# Patient Record
Sex: Male | Born: 1950 | Race: Black or African American | Hispanic: No | Marital: Married | Smoking: Former smoker
Health system: Southern US, Community
[De-identification: ages and names within clinical notes are randomized; demographics above are authoritative.]

## PROBLEM LIST (undated history)

## (undated) DIAGNOSIS — E7800 Pure hypercholesterolemia, unspecified: Secondary | ICD-10-CM

## (undated) DIAGNOSIS — R000 Tachycardia, unspecified: Secondary | ICD-10-CM

## (undated) DIAGNOSIS — Z9109 Other allergy status, other than to drugs and biological substances: Secondary | ICD-10-CM

## (undated) DIAGNOSIS — S299XXA Unspecified injury of thorax, initial encounter: Secondary | ICD-10-CM

## (undated) DIAGNOSIS — J9819 Other pulmonary collapse: Secondary | ICD-10-CM

## (undated) DIAGNOSIS — J449 Chronic obstructive pulmonary disease, unspecified: Secondary | ICD-10-CM

## (undated) DIAGNOSIS — N400 Enlarged prostate without lower urinary tract symptoms: Secondary | ICD-10-CM

## (undated) HISTORY — PX: CHEST TUBE INSERTION: SHX231

---

## 2002-12-12 ENCOUNTER — Emergency Department (HOSPITAL_COMMUNITY): Admission: EM | Admit: 2002-12-12 | Discharge: 2002-12-12 | Payer: Self-pay | Admitting: Emergency Medicine

## 2005-06-07 ENCOUNTER — Emergency Department (HOSPITAL_COMMUNITY): Admission: EM | Admit: 2005-06-07 | Discharge: 2005-06-07 | Payer: Self-pay | Admitting: Emergency Medicine

## 2015-03-04 ENCOUNTER — Emergency Department (HOSPITAL_COMMUNITY)
Admission: EM | Admit: 2015-03-04 | Discharge: 2015-03-04 | Disposition: A | Discharge status: Home / Self Care | Payer: Medicare Other | Attending: Emergency Medicine | Admitting: Emergency Medicine

## 2015-03-04 ENCOUNTER — Encounter (HOSPITAL_COMMUNITY): Payer: Self-pay | Admitting: Emergency Medicine

## 2015-03-04 DIAGNOSIS — X500XXA Overexertion from strenuous movement or load, initial encounter: Secondary | ICD-10-CM | POA: Diagnosis not present

## 2015-03-04 DIAGNOSIS — Z79899 Other long term (current) drug therapy: Secondary | ICD-10-CM | POA: Insufficient documentation

## 2015-03-04 DIAGNOSIS — Z7951 Long term (current) use of inhaled steroids: Secondary | ICD-10-CM | POA: Diagnosis not present

## 2015-03-04 DIAGNOSIS — N400 Enlarged prostate without lower urinary tract symptoms: Secondary | ICD-10-CM | POA: Insufficient documentation

## 2015-03-04 DIAGNOSIS — Y9389 Activity, other specified: Secondary | ICD-10-CM | POA: Diagnosis not present

## 2015-03-04 DIAGNOSIS — S3992XA Unspecified injury of lower back, initial encounter: Secondary | ICD-10-CM | POA: Diagnosis present

## 2015-03-04 DIAGNOSIS — Y998 Other external cause status: Secondary | ICD-10-CM | POA: Insufficient documentation

## 2015-03-04 DIAGNOSIS — M4306 Spondylolysis, lumbar region: Secondary | ICD-10-CM | POA: Insufficient documentation

## 2015-03-04 DIAGNOSIS — Y9289 Other specified places as the place of occurrence of the external cause: Secondary | ICD-10-CM | POA: Insufficient documentation

## 2015-03-04 DIAGNOSIS — S39012A Strain of muscle, fascia and tendon of lower back, initial encounter: Secondary | ICD-10-CM | POA: Diagnosis not present

## 2015-03-04 DIAGNOSIS — F17210 Nicotine dependence, cigarettes, uncomplicated: Secondary | ICD-10-CM | POA: Insufficient documentation

## 2015-03-04 DIAGNOSIS — M47896 Other spondylosis, lumbar region: Secondary | ICD-10-CM

## 2015-03-04 HISTORY — DX: Benign prostatic hyperplasia without lower urinary tract symptoms: N40.0

## 2015-03-04 HISTORY — DX: Unspecified injury of thorax, initial encounter: S29.9XXA

## 2015-03-04 MED ORDER — DEXAMETHASONE 4 MG PO TABS: 4.0000 mg | ORAL_TABLET | Freq: Two times a day (BID) | ORAL | Status: DC

## 2015-03-04 MED ORDER — PREDNISONE 50 MG PO TABS
50.0000 mg | ORAL_TABLET | Freq: Once | ORAL | Status: AC
  Administered 2015-03-04: 50 mg via ORAL
  Filled 2015-03-04: qty 1

## 2015-03-04 MED ORDER — CYCLOBENZAPRINE HCL 10 MG PO TABS
10.0000 mg | ORAL_TABLET | Freq: Once | ORAL | Status: AC
  Administered 2015-03-04: 10 mg via ORAL
  Filled 2015-03-04: qty 1

## 2015-03-04 MED ORDER — ACETAMINOPHEN-CODEINE #3 300-30 MG PO TABS
2.0000 | ORAL_TABLET | Freq: Once | ORAL | Status: AC
  Administered 2015-03-04: 2 via ORAL
  Filled 2015-03-04: qty 2

## 2015-03-04 MED ORDER — ACETAMINOPHEN-CODEINE #3 300-30 MG PO TABS: 1.0000 | ORAL_TABLET | Freq: Four times a day (QID) | ORAL | Status: DC | PRN

## 2015-03-04 MED ORDER — CYCLOBENZAPRINE HCL 10 MG PO TABS: 10.0000 mg | ORAL_TABLET | Freq: Three times a day (TID) | ORAL | Status: DC

## 2015-03-04 NOTE — ED Notes (Signed)
Having back pain for last 5 days, rates pain 10/10.  Urine is yellow and denies any urinary problems.  Denies any injury.
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
## 2015-03-04 NOTE — Discharge Instructions (Signed)

## 2015-04-24 ENCOUNTER — Encounter (HOSPITAL_COMMUNITY): Payer: Self-pay

## 2015-04-24 ENCOUNTER — Emergency Department (HOSPITAL_COMMUNITY): Payer: Medicare Other

## 2015-04-24 ENCOUNTER — Inpatient Hospital Stay (HOSPITAL_COMMUNITY): Payer: Medicare Other

## 2015-04-24 ENCOUNTER — Inpatient Hospital Stay (HOSPITAL_COMMUNITY)
Admission: EM | Admit: 2015-04-24 | Discharge: 2015-05-01 | DRG: 683 | Disposition: A | Discharge status: Home / Self Care | Payer: Medicare Other | Attending: Internal Medicine | Admitting: Internal Medicine

## 2015-04-24 DIAGNOSIS — D6489 Other specified anemias: Secondary | ICD-10-CM | POA: Diagnosis present

## 2015-04-24 DIAGNOSIS — D696 Thrombocytopenia, unspecified: Secondary | ICD-10-CM | POA: Diagnosis not present

## 2015-04-24 DIAGNOSIS — D649 Anemia, unspecified: Secondary | ICD-10-CM | POA: Diagnosis present

## 2015-04-24 DIAGNOSIS — R7989 Other specified abnormal findings of blood chemistry: Secondary | ICD-10-CM

## 2015-04-24 DIAGNOSIS — R945 Abnormal results of liver function studies: Secondary | ICD-10-CM

## 2015-04-24 DIAGNOSIS — F10239 Alcohol dependence with withdrawal, unspecified: Secondary | ICD-10-CM | POA: Diagnosis present

## 2015-04-24 DIAGNOSIS — F17210 Nicotine dependence, cigarettes, uncomplicated: Secondary | ICD-10-CM | POA: Diagnosis present

## 2015-04-24 DIAGNOSIS — Z23 Encounter for immunization: Secondary | ICD-10-CM | POA: Diagnosis not present

## 2015-04-24 DIAGNOSIS — F10231 Alcohol dependence with withdrawal delirium: Secondary | ICD-10-CM | POA: Diagnosis not present

## 2015-04-24 DIAGNOSIS — F10230 Alcohol dependence with withdrawal, uncomplicated: Secondary | ICD-10-CM | POA: Diagnosis not present

## 2015-04-24 DIAGNOSIS — F1010 Alcohol abuse, uncomplicated: Secondary | ICD-10-CM

## 2015-04-24 DIAGNOSIS — E876 Hypokalemia: Secondary | ICD-10-CM | POA: Diagnosis present

## 2015-04-24 DIAGNOSIS — E860 Dehydration: Secondary | ICD-10-CM | POA: Diagnosis present

## 2015-04-24 DIAGNOSIS — R1110 Vomiting, unspecified: Secondary | ICD-10-CM

## 2015-04-24 DIAGNOSIS — E872 Acidosis, unspecified: Secondary | ICD-10-CM

## 2015-04-24 DIAGNOSIS — D6959 Other secondary thrombocytopenia: Secondary | ICD-10-CM | POA: Diagnosis present

## 2015-04-24 DIAGNOSIS — Z8249 Family history of ischemic heart disease and other diseases of the circulatory system: Secondary | ICD-10-CM

## 2015-04-24 DIAGNOSIS — R112 Nausea with vomiting, unspecified: Secondary | ICD-10-CM | POA: Diagnosis present

## 2015-04-24 DIAGNOSIS — N179 Acute kidney failure, unspecified: Secondary | ICD-10-CM | POA: Diagnosis present

## 2015-04-24 DIAGNOSIS — K7010 Alcoholic hepatitis without ascites: Secondary | ICD-10-CM | POA: Diagnosis present

## 2015-04-24 DIAGNOSIS — F10939 Alcohol use, unspecified with withdrawal, unspecified: Secondary | ICD-10-CM | POA: Diagnosis not present

## 2015-04-24 DIAGNOSIS — K2920 Alcoholic gastritis without bleeding: Secondary | ICD-10-CM | POA: Diagnosis present

## 2015-04-24 HISTORY — DX: Other allergy status, other than to drugs and biological substances: Z91.09

## 2015-04-24 LAB — COMPREHENSIVE METABOLIC PANEL
ALBUMIN: 4.2 g/dL (ref 3.5–5.0)
ALK PHOS: 227 U/L — AB (ref 38–126)
ALT: 145 U/L — AB (ref 17–63)
AST: 501 U/L — ABNORMAL HIGH (ref 15–41)
BUN: 27 mg/dL — AB (ref 6–20)
CALCIUM: 9 mg/dL (ref 8.9–10.3)
CO2: 8 mmol/L — ABNORMAL LOW (ref 22–32)
Chloride: 82 mmol/L — ABNORMAL LOW (ref 101–111)
Creatinine, Ser: 2.27 mg/dL — ABNORMAL HIGH (ref 0.61–1.24)
GFR calc Af Amer: 33 mL/min — ABNORMAL LOW (ref >60)
GFR calc non Af Amer: 29 mL/min — ABNORMAL LOW (ref >60)
GLUCOSE: 163 mg/dL — AB (ref 65–99)
Potassium: 3.8 mmol/L (ref 3.5–5.1)
SODIUM: 134 mmol/L — AB (ref 135–145)
TOTAL PROTEIN: 8.6 g/dL — AB (ref 6.5–8.1)
Total Bilirubin: 1.5 mg/dL — ABNORMAL HIGH (ref 0.3–1.2)

## 2015-04-24 LAB — BLOOD GAS, ARTERIAL
ACID-BASE DEFICIT: 8 mmol/L — AB (ref 0.0–2.0)
BICARBONATE: 18.6 meq/L — AB (ref 20.0–24.0)
DRAWN BY: 234301
FIO2: 21
O2 Saturation: 97.1 %
PCO2 ART: 25.7 mmHg — AB (ref 35.0–45.0)
PH ART: 7.405 (ref 7.350–7.450)
PO2 ART: 99.2 mmHg (ref 80.0–100.0)

## 2015-04-24 LAB — CBC WITH DIFFERENTIAL/PLATELET
Basophils Absolute: 0 10*3/uL (ref 0.0–0.1)
Basophils Relative: 0 %
EOS ABS: 0 10*3/uL (ref 0.0–0.7)
Eosinophils Relative: 0 %
HEMATOCRIT: 37.6 % — AB (ref 39.0–52.0)
HEMOGLOBIN: 12.5 g/dL — AB (ref 13.0–17.0)
LYMPHS ABS: 1.3 10*3/uL (ref 0.7–4.0)
Lymphocytes Relative: 17 %
MCH: 32.8 pg (ref 26.0–34.0)
MCHC: 33.2 g/dL (ref 30.0–36.0)
MCV: 98.7 fL (ref 78.0–100.0)
Monocytes Absolute: 0.4 10*3/uL (ref 0.1–1.0)
Monocytes Relative: 5 %
NEUTROS ABS: 5.8 10*3/uL (ref 1.7–7.7)
NEUTROS PCT: 78 %
Platelets: 157 10*3/uL (ref 150–400)
RBC: 3.81 MIL/uL — AB (ref 4.22–5.81)
RDW: 18.1 % — ABNORMAL HIGH (ref 11.5–15.5)
WBC: 7.4 10*3/uL (ref 4.0–10.5)

## 2015-04-24 LAB — RAPID URINE DRUG SCREEN, HOSP PERFORMED
AMPHETAMINES: NOT DETECTED
BARBITURATES: NOT DETECTED
BENZODIAZEPINES: NOT DETECTED
Cocaine: NOT DETECTED
Opiates: NOT DETECTED
TETRAHYDROCANNABINOL: NOT DETECTED

## 2015-04-24 LAB — SALICYLATE LEVEL: Salicylate Lvl: <4 mg/dL (ref 2.8–30.0)

## 2015-04-24 LAB — LIPASE, BLOOD: Lipase: 58 U/L — ABNORMAL HIGH (ref 11–51)

## 2015-04-24 LAB — ACETAMINOPHEN LEVEL: Acetaminophen (Tylenol), Serum: <10 ug/mL — ABNORMAL LOW (ref 10–30)

## 2015-04-24 LAB — ETHANOL: Alcohol, Ethyl (B): 21 mg/dL — ABNORMAL HIGH (ref <5)

## 2015-04-24 MED ORDER — MORPHINE SULFATE (PF) 4 MG/ML IV SOLN
4.0000 mg | Freq: Once | INTRAVENOUS | Status: AC
  Administered 2015-04-24: 4 mg via INTRAVENOUS
  Filled 2015-04-24: qty 1

## 2015-04-24 MED ORDER — THIAMINE HCL 100 MG/ML IJ SOLN
100.0000 mg | Freq: Every day | INTRAMUSCULAR | Status: DC
  Administered 2015-04-26: 100 mg via INTRAVENOUS
  Filled 2015-04-24: qty 2

## 2015-04-24 MED ORDER — ACETAMINOPHEN 650 MG RE SUPP: 650.0000 mg | Freq: Four times a day (QID) | RECTAL | Status: DC | PRN

## 2015-04-24 MED ORDER — ACETAMINOPHEN 325 MG PO TABS
650.0000 mg | ORAL_TABLET | Freq: Four times a day (QID) | ORAL | Status: DC | PRN
  Administered 2015-04-30 (×3): 650 mg via ORAL
  Filled 2015-04-24 (×3): qty 2

## 2015-04-24 MED ORDER — SODIUM CHLORIDE 0.9 % IV BOLUS (SEPSIS): 1000.0000 mL | Freq: Once | INTRAVENOUS | Status: DC

## 2015-04-24 MED ORDER — LORAZEPAM 1 MG PO TABS
1.0000 mg | ORAL_TABLET | Freq: Four times a day (QID) | ORAL | Status: AC | PRN
  Administered 2015-04-25 – 2015-04-26 (×2): 1 mg via ORAL
  Filled 2015-04-24 (×2): qty 1

## 2015-04-24 MED ORDER — ONDANSETRON HCL 4 MG/2ML IJ SOLN: 4.0000 mg | Freq: Four times a day (QID) | INTRAMUSCULAR | Status: DC | PRN

## 2015-04-24 MED ORDER — VITAMIN B-1 100 MG PO TABS
100.0000 mg | ORAL_TABLET | Freq: Every day | ORAL | Status: DC
  Administered 2015-04-24 – 2015-05-01 (×7): 100 mg via ORAL
  Filled 2015-04-24 (×8): qty 1

## 2015-04-24 MED ORDER — ENOXAPARIN SODIUM 40 MG/0.4ML ~~LOC~~ SOLN
40.0000 mg | SUBCUTANEOUS | Status: DC
  Administered 2015-04-24: 40 mg via SUBCUTANEOUS
  Filled 2015-04-24: qty 0.4

## 2015-04-24 MED ORDER — FOLIC ACID 1 MG PO TABS
1.0000 mg | ORAL_TABLET | Freq: Every day | ORAL | Status: DC
  Administered 2015-04-24 – 2015-05-01 (×8): 1 mg via ORAL
  Filled 2015-04-24 (×8): qty 1

## 2015-04-24 MED ORDER — PANTOPRAZOLE SODIUM 40 MG IV SOLR
40.0000 mg | Freq: Two times a day (BID) | INTRAVENOUS | Status: DC
  Administered 2015-04-24 – 2015-04-25 (×3): 40 mg via INTRAVENOUS
  Filled 2015-04-24 (×4): qty 40

## 2015-04-24 MED ORDER — PANTOPRAZOLE SODIUM 40 MG IV SOLR
40.0000 mg | Freq: Once | INTRAVENOUS | Status: AC
  Administered 2015-04-24: 40 mg via INTRAVENOUS
  Filled 2015-04-24: qty 40

## 2015-04-24 MED ORDER — PNEUMOCOCCAL VAC POLYVALENT 25 MCG/0.5ML IJ INJ
0.5000 mL | INJECTION | INTRAMUSCULAR | Status: AC
  Administered 2015-04-25: 0.5 mL via INTRAMUSCULAR
  Filled 2015-04-24: qty 0.5

## 2015-04-24 MED ORDER — SODIUM CHLORIDE 0.9 % IV BOLUS (SEPSIS)
1000.0000 mL | Freq: Once | INTRAVENOUS | Status: AC
  Administered 2015-04-24: 1000 mL via INTRAVENOUS

## 2015-04-24 MED ORDER — ADULT MULTIVITAMIN W/MINERALS CH
1.0000 | ORAL_TABLET | Freq: Every day | ORAL | Status: DC
  Administered 2015-04-24 – 2015-05-01 (×8): 1 via ORAL
  Filled 2015-04-24 (×8): qty 1

## 2015-04-24 MED ORDER — GI COCKTAIL ~~LOC~~
30.0000 mL | Freq: Three times a day (TID) | ORAL | Status: DC | PRN
  Filled 2015-04-24: qty 30

## 2015-04-24 MED ORDER — TAMSULOSIN HCL 0.4 MG PO CAPS
0.4000 mg | ORAL_CAPSULE | Freq: Every day | ORAL | Status: DC
  Administered 2015-04-24 – 2015-05-01 (×8): 0.4 mg via ORAL
  Filled 2015-04-24 (×8): qty 1

## 2015-04-24 MED ORDER — ONDANSETRON HCL 4 MG PO TABS
4.0000 mg | ORAL_TABLET | Freq: Four times a day (QID) | ORAL | Status: DC | PRN
  Filled 2015-04-24: qty 1

## 2015-04-24 MED ORDER — SODIUM CHLORIDE 0.9% FLUSH
3.0000 mL | Freq: Two times a day (BID) | INTRAVENOUS | Status: DC
  Administered 2015-04-24 – 2015-04-30 (×8): 3 mL via INTRAVENOUS

## 2015-04-24 MED ORDER — SODIUM CHLORIDE 0.9 % IV SOLN
INTRAVENOUS | Status: DC
  Administered 2015-04-24 – 2015-04-26 (×4): via INTRAVENOUS

## 2015-04-24 MED ORDER — LORAZEPAM 2 MG/ML IJ SOLN
1.0000 mg | Freq: Four times a day (QID) | INTRAMUSCULAR | Status: AC | PRN
  Administered 2015-04-26 – 2015-04-27 (×4): 1 mg via INTRAVENOUS
  Filled 2015-04-24 (×5): qty 1

## 2015-04-24 MED ORDER — THIAMINE HCL 100 MG/ML IJ SOLN
Freq: Once | INTRAVENOUS | Status: AC
  Administered 2015-04-24: 17:00:00 via INTRAVENOUS
  Filled 2015-04-24: qty 1000

## 2015-04-24 MED ORDER — LORAZEPAM 2 MG/ML IJ SOLN
0.0000 mg | Freq: Four times a day (QID) | INTRAMUSCULAR | Status: AC
  Administered 2015-04-24 – 2015-04-26 (×3): 2 mg via INTRAVENOUS
  Filled 2015-04-24 (×2): qty 1

## 2015-04-24 MED ORDER — LORAZEPAM 2 MG/ML IJ SOLN
0.0000 mg | Freq: Two times a day (BID) | INTRAMUSCULAR | Status: AC
Start: 2015-04-26 — End: 2015-04-28
  Administered 2015-04-27 – 2015-04-28 (×2): 2 mg via INTRAVENOUS
  Filled 2015-04-24: qty 2
  Filled 2015-04-24 (×2): qty 1

## 2015-04-24 MED ORDER — ONDANSETRON HCL 4 MG/2ML IJ SOLN
4.0000 mg | Freq: Once | INTRAMUSCULAR | Status: AC
  Administered 2015-04-24: 4 mg via INTRAVENOUS
  Filled 2015-04-24: qty 2

## 2015-04-24 NOTE — ED Notes (Signed)
Pt states he has been drinking whiskey for about a week and not eating. Complain of belly pain, nausea and vomiting

## 2015-04-24 NOTE — ED Notes (Signed)
Pt c/o n/v and abd pain that started this morning.  Reports drank heavily on Saturday.  None since.  Pt reports started having uncontrollable shaking Saturday.  Denies diarrhea. LBM was yesterday morning.
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
## 2015-04-24 NOTE — ED Notes (Signed)
Pt given ice chips. Pt aware that he is being admitted

## 2015-04-24 NOTE — ED Notes (Signed)
cbg 198 per ems.

## 2015-04-25 ENCOUNTER — Inpatient Hospital Stay (HOSPITAL_COMMUNITY): Payer: Medicare Other

## 2015-04-25 DIAGNOSIS — N179 Acute kidney failure, unspecified: Secondary | ICD-10-CM | POA: Diagnosis not present

## 2015-04-25 DIAGNOSIS — D6489 Other specified anemias: Secondary | ICD-10-CM | POA: Diagnosis present

## 2015-04-25 DIAGNOSIS — D696 Thrombocytopenia, unspecified: Secondary | ICD-10-CM | POA: Diagnosis not present

## 2015-04-25 LAB — COMPREHENSIVE METABOLIC PANEL
ALK PHOS: 193 U/L — AB (ref 38–126)
ALT: 136 U/L — AB (ref 17–63)
ANION GAP: 11 (ref 5–15)
AST: 645 U/L — ABNORMAL HIGH (ref 15–41)
Albumin: 2.9 g/dL — ABNORMAL LOW (ref 3.5–5.0)
BUN: 25 mg/dL — ABNORMAL HIGH (ref 6–20)
CALCIUM: 7.3 mg/dL — AB (ref 8.9–10.3)
CHLORIDE: 99 mmol/L — AB (ref 101–111)
CO2: 25 mmol/L (ref 22–32)
CREATININE: 1.34 mg/dL — AB (ref 0.61–1.24)
GFR, EST NON AFRICAN AMERICAN: 54 mL/min — AB (ref >60)
Glucose, Bld: 169 mg/dL — ABNORMAL HIGH (ref 65–99)
Potassium: 3.7 mmol/L (ref 3.5–5.1)
SODIUM: 135 mmol/L (ref 135–145)
Total Bilirubin: 1.3 mg/dL — ABNORMAL HIGH (ref 0.3–1.2)
Total Protein: 5.8 g/dL — ABNORMAL LOW (ref 6.5–8.1)

## 2015-04-25 LAB — CBC
HCT: 27.4 % — ABNORMAL LOW (ref 39.0–52.0)
HEMOGLOBIN: 9.7 g/dL — AB (ref 13.0–17.0)
MCH: 32.7 pg (ref 26.0–34.0)
MCHC: 35.4 g/dL (ref 30.0–36.0)
MCV: 92.3 fL (ref 78.0–100.0)
PLATELETS: 91 10*3/uL — AB (ref 150–400)
RBC: 2.97 MIL/uL — AB (ref 4.22–5.81)
RDW: 17.5 % — ABNORMAL HIGH (ref 11.5–15.5)
WBC: 4.1 10*3/uL (ref 4.0–10.5)

## 2015-04-25 LAB — PROTIME-INR
INR: 1.2 (ref 0.00–1.49)
PROTHROMBIN TIME: 15.4 s — AB (ref 11.6–15.2)

## 2015-04-25 NOTE — Plan of Care (Signed)
Problem: Acute Rehab PT Goals(only PT should resolve) Goal: Pt Will Ambulate Pt will ambulate with RW at Supervision using a step-through pattern and equal step length for a distance greater than 549ft to demonstrate the ability to perform safe household distance ambulation at discharge.    Goal: Pt Will Go Up/Down Stairs Pt will ascend/descend 12 stairs with LRAD and 1 railing at Supervision to demonstrate safe entry/exit of home.

## 2015-04-25 NOTE — Evaluation (Signed)
Physical Therapy Evaluation Patient Details Name: Adam Flynn MRN: [id number] DOB: 02-14-1950 Today's Date: 04/25/2015   History of Present Illness  Adam Flynn is a 65yo black male who comes to [facility] c N/V ABD pain and low back pain, all after an episode of binge drinking alcohol. PMH: ETOH abuse, BPH, R eye prosthesis, L peripheral visual deficits. Wife reports pt has a chronic hisotry of binge drinking, and has been falling often at home. Physician reports pt came in very dehydrated and in past 24hrs, Hb: has decreased from 12.5 -->9.7, and HCT: 37.6 -->27.4.   Clinical Impression  At evaluation, pt is received semirecumbent in bed upon entry, wife present who supplements history. The pt is awake and agreeable to participate, but flat affect, delayed processing, and delayed response time, all of which the wife reports as acute. No acute distress noted at this time. The pt is oriented x3, but requires additional time to answer orientation questions. The patient is pleasant, conversational, and following simple commands 75% of the time, but does not often make eye contact. Pt reports 4 falls in the last 6 months, describing what sounds like orthostatc hypotension, to which he attributes his BPH medication; pt demonstrates multiple LOB throughout session requiring physical assistance for correction and avoidance of fall.   Pt grip strength is mildly weak and symmetrical; global strength as screened during functional mobility assessment presents c moderate-severe impairment in BUE/BLE, the pt now requiring maximal effort to perform transfers and gait, whereas the patient performs these indep at baseline and [place]. Gait is ataxic, wide based, with poor control of BLE, and finger to nose is impaired bilat. Light touch sensation is intact in BLE, BUE, and face. Gait c RW is very unsafe, opt demonstrating poor awareness, avoidance, and navigation around obstacles in room.hallway. Pt  falls risk is high as evidenced by slow gait speed, history falls recently, and cerebellar gait ataxia at eval with multiple LOB.   Patient presenting with impairment of strength, balance, motor control, coordination, and activity tolerance, limiting ability to perform ADL and mobility tasks at  baseline level of function. Patient will benefit from skilled intervention to address the above impairments and limitations, in order to restore to prior level of function, improve patient safety upon discharge, and to decrease falls risk. Have spoken to CSW who is aware of need for referral regarding chronic ETOH abuse, and I am recommending FU with neuro to assess pt for alcohol related cerebellar ataxia.       Follow Up Recommendations Outpatient PT;Supervision for mobility/OOB (Prefers [place], [place] ( Try [facility]) )    Equipment Recommendations  Rolling walker with 5" wheels    Recommendations for Other Services       Precautions / Restrictions Precautions Precautions: Fall      Mobility  Bed Mobility Overal bed mobility: Needs Assistance Bed Mobility: Supine to Sit;Sit to Supine     Supine to sit: Supervision Sit to supine: Supervision   General bed mobility comments: impulsive and mildly ataxic  Transfers Overall transfer level: Needs assistance Equipment used: None Transfers: Sit to/from Stand Sit to Stand: Min guard         General transfer comment: requires multiple attempts   Ambulation/Gait Ambulation/Gait assistance: Min assist Ambulation Distance (Feet): 150 Feet Assistive device: Rolling walker (2 wheeled);None Gait Pattern/deviations: Ataxic Gait velocity: .89m/s Gait velocity interpretation: <1.8 ft/sec, indicative of risk for recurrent falls General Gait Details: inititally attempted s AD,  gait very ataxic, wide based, unsteady, poor control/conssitency of step length, requires physical assistance to avoid fall. C rw, pt has difficult navigating  aroudn obstacle sin hallway, running into objects with the walker on L with poor correction. Very unsafe.   Stairs            Wheelchair Mobility    Modified Rankin (Stroke Patients Only)       Balance Overall balance assessment: Needs assistance;History of Falls (Pt is inappropriate for testing at his time due to ataxia and multiple LOB throughout session. ) Sitting-balance support: Bilateral upper extremity supported;Feet supported Sitting balance-Leahy Scale: Fair     Standing balance support: No upper extremity supported;During functional activity Standing balance-Leahy Scale: Poor   Single Leg Stance - Right Leg: 0 Single Leg Stance - Left Leg: 0                         Pertinent Vitals/Pain Pain Assessment: No/denies pain Pain Location: Stomach, does not rate. Mostly nausea more than pain.  Pain Intervention(s): Limited activity within patient's tolerance;Monitored during session    [place] expects to be discharged to:: Private residence Living Arrangements: Spouse/significant other Available Help at Discharge: Family Type of Home: House ([place], [place] residence ) Home Access: Stairs to enter   Technical brewer of Steps: [place]: One level [place]: Environmental consultant - 2 wheels      Prior Function Level of Independence: Independent               Hand Dominance   Dominant Hand: Right    Extremity/Trunk Assessment   Upper Extremity Assessment: Difficult to assess due to impaired cognition (dysmetria bilat, proximal and distal, laborious effort required to touch tip of nose. BUE 3+/5)           Lower Extremity Assessment: Generalized weakness (BLE 4/5 )         Communication   Communication: Expressive difficulties (delayed processing/response time)  Cognition Arousal/Alertness: Awake/alert Behavior During Therapy: WFL for tasks assessed/performed;Flat affect Overall Cognitive Status: Impaired/Different  from baseline Area of Impairment: Attention;Orientation Orientation Level: Person;Place;Time (requires additional time) Current Attention Level: Divided Memory: Decreased short-term memory              General Comments      Exercises        Assessment/Plan    PT Assessment Patient needs continued PT services  PT Diagnosis Difficulty walking;Abnormality of gait;Generalized weakness;Altered mental status   PT Problem List Decreased strength;Decreased range of motion;Decreased activity tolerance;Decreased balance;Decreased coordination;Decreased mobility;Cardiopulmonary status limiting activity  PT Treatment Interventions DME instruction;Gait training;Cognitive remediation;Patient/family education;Stair training;Functional mobility training;Therapeutic activities;Therapeutic exercise;Balance training;Neuromuscular re-education   PT Goals (Current goals can be found in the Care Plan section) Acute Rehab PT Goals Patient Stated Goal: imrpove cogntiion, balance, indep, stop drinking.  PT Goal Formulation: With family Time For Goal Achievement: 05/09/15 Potential to Achieve Goals: Fair    Frequency Min 3X/week   Barriers to discharge        Co-evaluation               End of Session Equipment Utilized During Treatment: Gait belt Activity Tolerance: Patient tolerated treatment well;Patient limited by fatigue;Patient limited by lethargy;No increased pain Patient left: in bed;with call bell/phone within reach;with family/visitor present           Time: [id number] PT Time Calculation (min) (ACUTE ONLY): 37 min   Charges:   PT Evaluation $PT Eval  Moderate Complexity: 1 Procedure PT Treatments $Therapeutic Activity: 23-37 mins   PT G Codes:        1:59 PM, 05-10-2015 Etta Grandchild, PT, DPT PRN Physical Therapist at [place] License # [id number] (wireless)  [phone] (mobile)

## 2015-04-25 NOTE — Progress Notes (Signed)
[facility] PROGRESS NOTE  Adam Flynn [id number] DOB: November 06, 1950 DOA: 04/24/2015 PCP: [facility] Summary: 64 yom with history of alcoholism and recent binge drinking over the weekend presented with complaints nausea, vomiting, and abdominal pain. Suspect related to alcoholic gastritis and started on PPI cocktail. Placed on clear liquids , but appears to be tolerating advancing diet without issues. Placed on CIWA protocol, with no tremors at this time. He was also significantly dehydration, with AKI and alcoholic ketoacidosis. Improving with IVF. Repeat LFTs in AM and if improved, Anticipate discharge in 24 hours.   Assessment/Plan: 1. Nausea, vomiting, abdominal pain. Improved. Suspect this is related to alcoholic gastritis. Started on PPI GI cocktail. Abdominal US with no evidence of cholecystitis. Continue to advance diet as tolerated. 2. Alcoholic hepatitis. Liver enzymes remain elevated. Discriminate function is only 17, making benefits from steroids unlikely. Continue with IVF and repeat labs in the am.  3. Acute kidney injury. Likely related to dehydration. Improved with hydration.  4. Suspected alcoholic ketoacidosis. Continue with hydration for now. Given the patient thiamine/folate. 5. History of alcohol abuse. Start the patient on CIWA protocol. No signs of withdrawal at this time 6. Dehydration. Continue IV fluids. 7. Thrombocytopenia. Likely related to alcohol abuse. Continue to follow. 8. Normocytic anemia, No evidence of bleeding. Will continue to monitor. Likely related to alcohol abuse. Continue to follow. 9. Weakness, wife reports frequent falls and weakness. Will consult PT.  Code Status:Full DVT prophylaxis: SCDs Family Communication: Wife bedside  Disposition Plan: Anticipate discharge in 24 hours.    Consultants:  None   Procedures:  None   Antibiotics:  None   HPI/Subjective: Feels better denies nausea, vomiting, or  abdominal pain. Has been able to eat without difficulty.   Objective: Filed Vitals:   04/24/15 2126 04/25/15 0540  BP: 136/82 130/66  Pulse: 99 87  Temp: 98.9 F (37.2 C) 98.7 F (37.1 C)  Resp: 20 20    Intake/Output Summary (Last 24 hours) at 04/25/15 0804 Last data filed at 04/25/15 0540  Gross per 24 hour  Intake      0 ml  Output    200 ml  Net   -200 ml   Filed Weights   04/24/15 1047 04/24/15 1730  Weight: 58.968 kg (130 lb) 59.875 kg (132 lb)    Exam: General: NAD, looks comfortable Cardiovascular: RRR, S1, S2  Respiratory: clear bilaterally, No wheezing, rales or rhonchi Abdomen: soft, non tender, no distention , bowel sounds normal Musculoskeletal: No edema b/l  Data Reviewed: Basic Metabolic Panel:  Recent Labs Lab 04/24/15 1111 04/25/15 0637  NA 134* 135  K 3.8 3.7  CL 82* 99*  CO2 8* 25  GLUCOSE 163* 169*  BUN 27* 25*  CREATININE 2.27* 1.34*  CALCIUM 9.0 7.3*   Liver Function Tests:  Recent Labs Lab 04/24/15 1111 04/25/15 0637  AST 501* 645*  ALT 145* 136*  ALKPHOS 227* 193*  BILITOT 1.5* 1.3*  PROT 8.6* 5.8*  ALBUMIN 4.2 2.9*    Recent Labs Lab 04/24/15 1111  LIPASE 58*   No results for input(s): AMMONIA in the last 168 hours. CBC:  Recent Labs Lab 04/24/15 1111 04/25/15 0637  WBC 7.4 4.1  NEUTROABS 5.8  --   HGB 12.5* 9.7*  HCT 37.6* 27.4*  MCV 98.7 92.3  PLT 157 91*    Studies: Ct Abdomen Pelvis Wo Contrast  04/24/2015  CLINICAL DATA:  Abdominal pain with nausea and vomiting today. Recent heavy  alcohol ingestion. EXAM: CT ABDOMEN AND PELVIS WITHOUT CONTRAST TECHNIQUE: Multidetector CT imaging of the abdomen and pelvis was performed following the standard protocol without IV contrast. COMPARISON:  None. FINDINGS: Lower chest: Mild dependent atelectasis at both lung bases. No significant pleural or pericardial effusion. There is a small hiatal hernia with distal esophageal wall thickening. Hepatobiliary: Severe hepatic  steatosis. No focal lesions identified on noncontrast imaging. Gallbladder evaluation is mildly limited by breathing artifact. The gallbladder is distended with intermediate density bile. No calcified gallstones or definite surrounding inflammation identified. Pancreas: Unremarkable. No pancreatic ductal dilatation or surrounding inflammatory changes. Spleen: Normal in size without focal abnormality. Adrenals/Urinary Tract: Both adrenal glands appear normal. Both kidneys appear unremarkable. The urinary tract calculus or hydronephrosis. There is no perinephric soft tissue stranding. The bladder appears unremarkable. Stomach/Bowel: No evidence of bowel wall thickening, distention or surrounding inflammatory change. The appendix appears normal. Vascular/Lymphatic: There are multiple small calcified lymph nodes within the porta hepatis. No enlarged abdominal pelvic lymph nodes are identified. Mild aortic and branch vessel atherosclerosis. Reproductive: The prostate gland is mildly enlarged with central dystrophic calcifications. There is asymmetric enlargement of the right seminal vesicle which also contains calcifications. Other: No ascites or free intraperitoneal air. The anterior abdominal wall appears unremarkable. Musculoskeletal: No acute or significant osseous findings. Mild lumbar spondylosis. There is a synovial herniation pit anteriorly in the right femoral neck. IMPRESSION: 1. No definite acute findings.  No evidence of pancreatitis. 2. Severe hepatic steatosis. 3. The gallbladder is distended with intermediate density bile. No calcified gallstone or obvious pericholecystic inflammation. Ultrasound may be helpful for further evaluation if there is concern of gallbladder pathology. 4. Calcified lymph nodes in the porta hepatis consistent with prior inflammation. 5. Mild distal esophageal wall thickening. 6. Mild atherosclerosis. 7. Calcifications in the prostate gland and seminal vesicles. Electronically  Signed   By: Richardean Sale M.D.   On: 04/24/2015 13:22    Scheduled Meds: . enoxaparin (LOVENOX) injection  40 mg Subcutaneous Q24H  . folic acid  1 mg Oral Daily  . LORazepam  0-4 mg Intravenous Q6H   Followed by  . [START ON 04/26/2015] LORazepam  0-4 mg Intravenous Q12H  . multivitamin with minerals  1 tablet Oral Daily  . pantoprazole (PROTONIX) IV  40 mg Intravenous Q12H  . pneumococcal 23 valent vaccine  0.5 mL Intramuscular Tomorrow-1000  . sodium chloride flush  3 mL Intravenous Q12H  . tamsulosin  0.4 mg Oral Daily  . thiamine  100 mg Oral Daily   Or  . thiamine  100 mg Intravenous Daily   Continuous Infusions: . sodium chloride 150 mL/hr at 04/25/15 0425    Active Problems:   AKI (acute kidney injury) ([place])   Dehydration   Alcoholic hepatitis   Alcoholic gastritis   Nausea & vomiting    Time spent: 25 minutes     Kathie Dike, MD  [facility] Pager [phone]. If 7PM-7AM, please contact night-coverage at [email or web address], password [facility] 04/25/2015, 8:04 AM  LOS: 1 day   By signing my name below, I, Rennis Harding, attest that this documentation has been prepared under the direction and in the presence of Kathie Dike, MD. Electronically signed: Rennis Harding, Scribe. 04/25/2015 9:27am    I, Dr. Kathie Dike, personally performed the services described in this documentaiton. All medical record entries made by the scribe were at my direction and in my presence. I have reviewed the chart and agree that the record reflects my personal performance and is  accurate and complete  Kathie Dike, MD, 04/25/2015 9:44 AM

## 2015-04-25 NOTE — Progress Notes (Signed)
LCSW received consult for: Current Substance Abuse. Will assess patient and provide appropriate resources once patient less confused.  Will complete SBIRT if patient allows on 3/29.  Lane Hacker, MSW

## 2015-04-26 DIAGNOSIS — K2920 Alcoholic gastritis without bleeding: Secondary | ICD-10-CM

## 2015-04-26 DIAGNOSIS — D6489 Other specified anemias: Secondary | ICD-10-CM

## 2015-04-26 DIAGNOSIS — N179 Acute kidney failure, unspecified: Principal | ICD-10-CM

## 2015-04-26 DIAGNOSIS — D696 Thrombocytopenia, unspecified: Secondary | ICD-10-CM

## 2015-04-26 DIAGNOSIS — K7010 Alcoholic hepatitis without ascites: Secondary | ICD-10-CM

## 2015-04-26 DIAGNOSIS — E876 Hypokalemia: Secondary | ICD-10-CM | POA: Diagnosis not present

## 2015-04-26 LAB — COMPREHENSIVE METABOLIC PANEL
ALBUMIN: 3 g/dL — AB (ref 3.5–5.0)
ALT: 115 U/L — AB (ref 17–63)
AST: 395 U/L — AB (ref 15–41)
Alkaline Phosphatase: 183 U/L — ABNORMAL HIGH (ref 38–126)
Anion gap: 11 (ref 5–15)
BILIRUBIN TOTAL: 1.2 mg/dL (ref 0.3–1.2)
BUN: 10 mg/dL (ref 6–20)
CO2: 23 mmol/L (ref 22–32)
CREATININE: 0.86 mg/dL (ref 0.61–1.24)
Calcium: 7.6 mg/dL — ABNORMAL LOW (ref 8.9–10.3)
Chloride: 98 mmol/L — ABNORMAL LOW (ref 101–111)
GFR calc Af Amer: >60 mL/min (ref >60)
GLUCOSE: 90 mg/dL (ref 65–99)
POTASSIUM: 3 mmol/L — AB (ref 3.5–5.1)
Sodium: 132 mmol/L — ABNORMAL LOW (ref 135–145)
TOTAL PROTEIN: 5.8 g/dL — AB (ref 6.5–8.1)

## 2015-04-26 LAB — CBC
HEMATOCRIT: 28.2 % — AB (ref 39.0–52.0)
Hemoglobin: 9.7 g/dL — ABNORMAL LOW (ref 13.0–17.0)
MCH: 32 pg (ref 26.0–34.0)
MCHC: 34.4 g/dL (ref 30.0–36.0)
MCV: 93.1 fL (ref 78.0–100.0)
PLATELETS: 76 10*3/uL — AB (ref 150–400)
RBC: 3.03 MIL/uL — ABNORMAL LOW (ref 4.22–5.81)
RDW: 17.7 % — AB (ref 11.5–15.5)
WBC: 4.1 10*3/uL (ref 4.0–10.5)

## 2015-04-26 MED ORDER — NICOTINE 14 MG/24HR TD PT24
14.0000 mg | MEDICATED_PATCH | Freq: Every day | TRANSDERMAL | Status: DC
  Administered 2015-04-26 – 2015-04-30 (×5): 14 mg via TRANSDERMAL
  Filled 2015-04-26 (×5): qty 1

## 2015-04-26 MED ORDER — POTASSIUM CHLORIDE IN NACL 40-0.9 MEQ/L-% IV SOLN
INTRAVENOUS | Status: DC
  Administered 2015-04-27 – 2015-04-29 (×3): 60 mL/h via INTRAVENOUS

## 2015-04-26 MED ORDER — POTASSIUM CHLORIDE CRYS ER 20 MEQ PO TBCR
40.0000 meq | EXTENDED_RELEASE_TABLET | Freq: Two times a day (BID) | ORAL | Status: AC
  Administered 2015-04-26 (×2): 40 meq via ORAL
  Filled 2015-04-26 (×2): qty 2

## 2015-04-26 MED ORDER — PANTOPRAZOLE SODIUM 40 MG PO TBEC
40.0000 mg | DELAYED_RELEASE_TABLET | Freq: Two times a day (BID) | ORAL | Status: DC
  Administered 2015-04-26 – 2015-05-01 (×10): 40 mg via ORAL
  Filled 2015-04-26 (×10): qty 1

## 2015-04-26 NOTE — Progress Notes (Signed)
[facility] PROGRESS NOTE  Adam Flynn [id number] DOB: 18-Dec-1950 DOA: 04/24/2015 PCP: [facility] Summary: 65 yom with history of alcoholism and recent binge drinking over the weekend presented with complaints nausea, vomiting, and abdominal pain. Suspect related to alcoholic gastritis and started on PPI cocktail. Placed on clear liquids , but appears to be tolerating advancing diet without issues. Placed on CIWA protocol, with no tremors at this time. He was also significantly dehydrated with AKI and alcoholic ketoacidosis. Improving with IVF.    Assessment/Plan: Nausea, vomiting, abdominal pain.  Suspect this is related to alcoholic gastritis. Started on PPI GI cocktail. Abdominal US with no evidence of acute cholecystitis. Continue to advance diet as tolerated. N/V/pain resolved.  Alcoholic hepatitis.  Liver enzymes remain elevated, but are trending downward. Discriminate function is only 17, making benefits from steroids unlikely. -Ultrasound of his abdomen revealed mild gallbladder distention with gallbladder sludge, but no sonographic evidence to suggest acute cholecystitis; hepatic steatosis.  -For completion, will order an acute viral hepatitis panel. Continue supportive treatment   Acute kidney injury secondary to dehydration..  Likely related to dehydration. His renal function has improved progressively. We'll decrease IV fluids.  Hypokalemia. Likely associated with IV fluids without potassium chloride. We'll change IV fluids to add potassium chloride and will start oral potassium chloride supplementation. We will order a magnesium level to rule out deficiency.   Suspected alcoholic ketoacidosis. Patient was started on vigorous IV fluids. His acidosis has clinically resolved. We'll decrease the IV fluids.  History of alcohol abuse with mild alcohol withdrawal syndrome.  Patient was started on the CIWA protocol and vitamin  therapy. Clinical social worker was consulted for assistance with sobriety.  Thrombocytopenia.  Likely related to alcohol abuse; possibly dilutional with vigorous IV fluids on admission. Will order vitamin B12 and TSH for further evaluation.  Normocytic anemia,  No evidence of bleeding. We'll order total iron and ferritin level for further evaluation.  Will continue to monitor. Likely related to alcohol abuse. Continue to follow.  Weakness, wife reports frequent falls and weakness. PT consulted.   Code Status:Full DVT prophylaxis: SCDs Family Communication: Family not available  Disposition Plan: Anticipate discharge in the next 24-48 hours   Consultants:  None   Procedures:  None   Antibiotics:  None   HPI/Subjective: Patient denies chest pain, shortness of breath, nausea, vomiting, or diarrhea. Nursing staff reports intermittent confusion and patient tries to get out of bed unassisted.  Objective: Filed Vitals:   04/26/15 0500 04/26/15 1458  BP: 122/74 139/90  Pulse: 85 92  Temp: 98.2 F (36.8 C) 98.5 F (36.9 C)  Resp: 20 20  Oxygen saturation 100%.  Intake/Output Summary (Last 24 hours) at 04/26/15 1748 Last data filed at 04/26/15 1705  Gross per 24 hour  Intake    480 ml  Output   1701 ml  Net  -1221 ml   Filed Weights   04/24/15 1047 04/24/15 1730  Weight: 58.968 kg (130 lb) 59.875 kg (132 lb)    Exam: General:65 year old man in no acute distress. Cardiovascular: S1, S2, no murmurs rubs or gallops. Respiratory: Decreased breath sounds in the bases, clear anteriorly. Abdomen: Positive bowel sounds, soft, nontender, nondistended. Musculoskeletal: No pedal edema. Neurologic: He is alert and oriented to himself and hospital. He is on sure about the year and the month. His speech is clear. He follows directions. No signs of tremulousness.  Data Reviewed: Basic Metabolic Panel:  Recent Labs Lab 04/24/15 1111 04/25/15  [id number] 04/26/15 0634  NA  134* 135 132*  K 3.8 3.7 3.0*  CL 82* 99* 98*  CO2 8* 25 23  GLUCOSE 163* 169* 90  BUN 27* 25* 10  CREATININE 2.27* 1.34* 0.86  CALCIUM 9.0 7.3* 7.6*   Liver Function Tests:  Recent Labs Lab 04/24/15 1111 06-May-2015 0637 04/26/15 0634  AST 501* 645* 395*  ALT 145* 136* 115*  ALKPHOS 227* 193* 183*  BILITOT 1.5* 1.3* 1.2  PROT 8.6* 5.8* 5.8*  ALBUMIN 4.2 2.9* 3.0*    Recent Labs Lab 04/24/15 1111  LIPASE 58*   No results for input(s): AMMONIA in the last 168 hours. CBC:  Recent Labs Lab 04/24/15 1111 05-06-15 0637 04/26/15 0634  WBC 7.4 4.1 4.1  NEUTROABS 5.8  --   --   HGB 12.5* 9.7* 9.7*  HCT 37.6* 27.4* 28.2*  MCV 98.7 92.3 93.1  PLT 157 91* 76*    Studies: US Abdomen Limited Ruq  May 06, 2015  CLINICAL DATA:  Elevated LFTs with nausea and vomiting. ETOH 30 years. Acute renal failure. EXAM: US ABDOMEN LIMITED - RIGHT UPPER QUADRANT COMPARISON:  CT 04/24/2015 FINDINGS: Gallbladder: Gallbladder somewhat distended with moderate amount of sludge. No evidence of gallstones or wall thickening. No pericholecystic fluid. Negative sonographic Murphy sign. Common bile duct: Diameter: 6.0 mm. Liver: Evidence of mild diffuse hepatic steatosis without focal mass. Tiny amount of free fluid adjacent the left lobe of the liver. IMPRESSION: Mild gallbladder distension with moderate amount of gallbladder sludge. No additional sonographic evidence to suggest acute cholecystitis. Hepatic steatosis.  Trace amount of ascites. Electronically Signed   By: Adam Flynn M.D.   On: 2015/05/06 08:13    Scheduled Meds: . folic acid  1 mg Oral Daily  . LORazepam  0-4 mg Intravenous Q12H  . multivitamin with minerals  1 tablet Oral Daily  . pantoprazole  40 mg Oral BID  . potassium chloride  40 mEq Oral BID  . sodium chloride flush  3 mL Intravenous Q12H  . tamsulosin  0.4 mg Oral Daily  . thiamine  100 mg Oral Daily   Or  . thiamine  100 mg Intravenous Daily   Continuous Infusions: .  0.9 % NaCl with KCl 40 mEq / L      Principal Problem:   Nausea & vomiting Active Problems:   Alcoholic hepatitis   Alcoholic gastritis   AKI (acute kidney injury) ([place])   Dehydration   Thrombocytopenia ([facility])   Anemia due to other cause   Hypokalemia    Time spent: 30 minutes     Adam Alberts, MD  [facility] Pager [phone] If 7PM-7AM, please contact night-coverage at [email or web address], password [facility] 04/26/2015, 5:48 PM  LOS: 2 days

## 2015-04-26 NOTE — Progress Notes (Signed)
Pt extremely restless and constantly trying to get out of bed despite administration of IV med. Family member is at bedside. IV restarted - Fluids infusing. Mittens applied due to constant pulling at lines and devices. Condom cath on and working well. Fall arrestor pad in place as family member chair is against anatomical left side of bed.

## 2015-04-26 NOTE — Care Management Note (Signed)
Case Management Note  Patient Details  Name: JAHAAD REPPERT MRN: [id number] Date of Birth: 08/19/1950  Subjective/Objective:        Spoke with patient who is from home with sister and brother.  Patient stated that he has a walker . Sister takes him to his appointments. Patient is seen at [facility]. Denies issues with obtaining medications. Patient is already being seen at outpatient physical therapy prior to admission.  No other DME             Action/Plan: Continue to follow for changing discharge needs.  Anticipate discharge home with self care.   Expected Discharge Date:                  Expected Discharge Plan:  Home/Self Care  In-House Referral:     Discharge planning Services  CM Consult  Post Acute Care Choice:    Choice offered to:     DME Arranged:    DME Agency:     HH Arranged:    HH Agency:     Status of Service:  In process, will continue to follow  Medicare Important Message Given:    Date Medicare IM Given:    Medicare IM give by:    Date Additional Medicare IM Given:    Additional Medicare Important Message give by:     If discussed at [place] of Stay Meetings, dates discussed:    Additional Comments:  Alvie Heidelberg, RN 04/26/2015, 1:03 PM

## 2015-04-27 DIAGNOSIS — F1010 Alcohol abuse, uncomplicated: Secondary | ICD-10-CM | POA: Diagnosis present

## 2015-04-27 DIAGNOSIS — F10231 Alcohol dependence with withdrawal delirium: Secondary | ICD-10-CM

## 2015-04-27 DIAGNOSIS — F10239 Alcohol dependence with withdrawal, unspecified: Secondary | ICD-10-CM | POA: Diagnosis not present

## 2015-04-27 DIAGNOSIS — F10939 Alcohol use, unspecified with withdrawal, unspecified: Secondary | ICD-10-CM | POA: Diagnosis not present

## 2015-04-27 LAB — BASIC METABOLIC PANEL
ANION GAP: 10 (ref 5–15)
BUN: 6 mg/dL (ref 6–20)
CHLORIDE: 101 mmol/L (ref 101–111)
CO2: 25 mmol/L (ref 22–32)
Calcium: 8.3 mg/dL — ABNORMAL LOW (ref 8.9–10.3)
Creatinine, Ser: 0.73 mg/dL (ref 0.61–1.24)
GFR calc non Af Amer: >60 mL/min (ref >60)
Glucose, Bld: 93 mg/dL (ref 65–99)
POTASSIUM: 3 mmol/L — AB (ref 3.5–5.1)
SODIUM: 136 mmol/L (ref 135–145)

## 2015-04-27 LAB — IRON AND TIBC
IRON: 55 ug/dL (ref 45–182)
SATURATION RATIOS: 21 % (ref 17.9–39.5)
TIBC: 258 ug/dL (ref 250–450)
UIBC: 203 ug/dL

## 2015-04-27 LAB — CBC
HEMATOCRIT: 29.1 % — AB (ref 39.0–52.0)
HEMOGLOBIN: 10.3 g/dL — AB (ref 13.0–17.0)
MCH: 32.9 pg (ref 26.0–34.0)
MCHC: 35.4 g/dL (ref 30.0–36.0)
MCV: 93 fL (ref 78.0–100.0)
Platelets: 74 10*3/uL — ABNORMAL LOW (ref 150–400)
RBC: 3.13 MIL/uL — AB (ref 4.22–5.81)
RDW: 16.8 % — ABNORMAL HIGH (ref 11.5–15.5)
WBC: 4.9 10*3/uL (ref 4.0–10.5)

## 2015-04-27 LAB — TSH: TSH: 3.858 u[IU]/mL (ref 0.350–4.500)

## 2015-04-27 LAB — FOLATE: FOLATE: 14.8 ng/mL (ref >5.9)

## 2015-04-27 LAB — VITAMIN B12: Vitamin B-12: 850 pg/mL (ref 180–914)

## 2015-04-27 LAB — MAGNESIUM: MAGNESIUM: 1.1 mg/dL — AB (ref 1.7–2.4)

## 2015-04-27 LAB — FERRITIN: Ferritin: 505 ng/mL — ABNORMAL HIGH (ref 24–336)

## 2015-04-27 MED ORDER — POTASSIUM CHLORIDE 10 MEQ/100ML IV SOLN
10.0000 meq | INTRAVENOUS | Status: AC
  Administered 2015-04-27 (×3): 10 meq via INTRAVENOUS
  Filled 2015-04-27: qty 100

## 2015-04-27 MED ORDER — MAGNESIUM SULFATE 50 % IJ SOLN: 2.0000 g | Freq: Once | INTRAVENOUS | Status: DC

## 2015-04-27 MED ORDER — MAGNESIUM SULFATE 2 GM/50ML IV SOLN
2.0000 g | Freq: Once | INTRAVENOUS | Status: AC
Start: 2015-04-27 — End: 2015-04-27
  Administered 2015-04-27: 2 g via INTRAVENOUS
  Filled 2015-04-27: qty 50

## 2015-04-27 MED ORDER — LORAZEPAM 2 MG/ML IJ SOLN
2.0000 mg | INTRAMUSCULAR | Status: AC
  Administered 2015-04-27: 2 mg via INTRAVENOUS
  Filled 2015-04-27: qty 1

## 2015-04-27 MED ORDER — CLONIDINE HCL 0.1 MG PO TABS: 0.1000 mg | ORAL_TABLET | Freq: Three times a day (TID) | ORAL | Status: DC | PRN

## 2015-04-27 NOTE — Progress Notes (Signed)
PT Cancellation Note  Patient Details Name: Adam Flynn MRN: [id number] DOB: [birth year]   Cancelled Treatment:    Reason Eval/Treat Not Completed: Medical issues which prohibited therapy  Treatment held per RN stating pt not medically appropriate.  [address] LPTA; [place]  Aldona Lento 04/27/2015, 10:54 AM

## 2015-04-27 NOTE — Progress Notes (Signed)
LCSW following acutely for substance abuse. Patient remains confused and unable to participate in assessment or SBIRT.  Will continue to follow and assess when clinically stable and able to participate.  Lane Hacker, MSW

## 2015-04-27 NOTE — Progress Notes (Signed)
Pt still tries to exit the bed and has to be attempted at reorienting. Pt is still disoriented and very restless despite CIWA protocol use.

## 2015-04-27 NOTE — Progress Notes (Signed)
[facility] PROGRESS NOTE  WALFRED FARSON [id number] DOB: 08-26-1950 DOA: 04/24/2015 PCP: [facility] Summary: 65 yom with history of alcoholism and recent binge drinking over the weekend presented with complaints nausea, vomiting, and abdominal pain. Suspect related to alcoholic gastritis.   Assessment/Plan: Nausea, vomiting, abdominal pain secondary to alcoholic gastritis.   Patient was started on PPI a and GI cocktail. Abdominal US with no evidence of acute cholecystitis. Clear liquid diet was started which he tolerated. It was advanced with good toleration. His GI symptoms have resolved.  Alcoholic hepatitis.  Liver enzymes remain elevated, but are trending downward. Discriminate function is only 17, making benefits from steroids unlikely. -Ultrasound of his abdomen revealed mild gallbladder distention with gallbladder sludge, but no sonographic evidence to suggest acute cholecystitis; hepatic steatosis.  -For completion, acute viral hepatitis panel was ordered;results are pending. Continue supportive treatment.   Alcohol abuse with  alcoholwithdrawal syndrome  reportedly, the patient drinks a fifth of gin weekly. He he did not demonstrate alcohol withdrawal delirium initially, but he has become progressively more agitated and confused , consistent with alcohol withdrawal syndrome. His blood pressure is also increasing.  -CIWA protocol with Ativan and vitamin therapy was ordered on admission. -Will order clonidine when necessary for systolic blood pressure of greater than 160 due to its CNS effects. -Clinical social worker consulted to assist with sobriety.  Acute kidney injury secondary to dehydration..  Likely related to dehydration. His renal function has improved progressively. IV fluids were decreased.  Hypokalemia. Likely associated with Initial IV fluids without potassium chloride; rule out magnesium deficiency. IV fluids change to add  potassium chloride supplementation. Oral potassium started.  -Serum potassium is still low. -We will order 3 runs of IV potassium chloride. We'll give 2 g of magnesium sulfate empirically. Magnesium level ordered and is pending.  Suspected alcoholic ketoacidosis. Patient was started on vigorous IV fluids. His acidosis has clinically resolved. IV fluids decreased.  Thrombocytopenia.  Likely related to alcohol abuse; possibly dilutional with vigorous IV fluids on admission. -Vitamin B12 level and TSH ordered for evaluation. Vitamin B12 pending. TSH was within normal limits.  Normocytic anemia,  No evidence of bleeding. Total iron and ferritin level ordered for further evaluation.  Will continue to monitor. Likely related to alcohol abuse. Continue to follow.  Weakness, wife reported frequent falls and weakness. PT consulted.   Code Status:Full DVT prophylaxis: SCDs Family Communication:  Discussed with daughter Alyse Low  Disposition Plan:  Discharge when clinically appropriate   Consultants:  None   Procedures:  None   Antibiotics:  None   HPI/Subjective: Patient  Is confused. His daughter is at his bedside. Nursing reports that the patient has become increasingly more confused, agitated, and disoriented.  Objective: Filed Vitals:   04/26/15 1458 04/26/15 2200  BP: 139/90 149/91  Pulse: 92 79  Temp: 98.5 F (36.9 C) 98.1 F (36.7 C)  Resp: 20 20  Oxygen saturation  95%.  Intake/Output Summary (Last 24 hours) at 04/27/15 1224 Last data filed at 04/27/15 0830  Gross per 24 hour  Intake 1202.5 ml  Output   2050 ml  Net -847.5 ml   Filed Weights   04/24/15 1047 04/24/15 1730  Weight: 58.968 kg (130 lb) 59.875 kg (132 lb)    Exam: General:65 year old man in no acute distress; appears more disheveled. Cardiovascular: S1, S2, no murmurs rubs or gallops. Respiratory: Decreased breath sounds in the bases, clear anteriorly. Abdomen: Positive bowel sounds, soft,  nontender, nondistended.  Musculoskeletal: No pedal edema. Neurologic: He is mildly sedated. He is oriented to himself, but not place, daughter, year, or month. He is mildly tremulous.  Data Reviewed: Basic Metabolic Panel:  Recent Labs Lab 04/24/15 1111 04/25/15 0637 04/26/15 0634 04/27/15 0653  NA 134* 135 132* 136  K 3.8 3.7 3.0* 3.0*  CL 82* 99* 98* 101  CO2 8* 25 23 25   GLUCOSE 163* 169* 90 93  BUN 27* 25* 10 6  CREATININE 2.27* 1.34* 0.86 0.73  CALCIUM 9.0 7.3* 7.6* 8.3*   Liver Function Tests:  Recent Labs Lab 04/24/15 1111 04/25/15 0637 04/26/15 0634  AST 501* 645* 395*  ALT 145* 136* 115*  ALKPHOS 227* 193* 183*  BILITOT 1.5* 1.3* 1.2  PROT 8.6* 5.8* 5.8*  ALBUMIN 4.2 2.9* 3.0*    Recent Labs Lab 04/24/15 1111  LIPASE 58*   No results for input(s): AMMONIA in the last 168 hours. CBC:  Recent Labs Lab 04/24/15 1111 04/25/15 0637 04/26/15 0634 04/27/15 0653  WBC 7.4 4.1 4.1 4.9  NEUTROABS 5.8  --   --   --   HGB 12.5* 9.7* 9.7* 10.3*  HCT 37.6* 27.4* 28.2* 29.1*  MCV 98.7 92.3 93.1 93.0  PLT 157 91* 76* 74*    Studies: No results found.  Scheduled Meds: . folic acid  1 mg Oral Daily  . LORazepam  0-4 mg Intravenous Q12H  . multivitamin with minerals  1 tablet Oral Daily  . nicotine  14 mg Transdermal QHS  . pantoprazole  40 mg Oral BID  . sodium chloride flush  3 mL Intravenous Q12H  . tamsulosin  0.4 mg Oral Daily  . thiamine  100 mg Oral Daily   Or  . thiamine  100 mg Intravenous Daily   Continuous Infusions: . 0.9 % NaCl with KCl 40 mEq / L 50 mL/hr at 04/27/15 [id number]    Principal Problem:   Nausea & vomiting Active Problems:   Alcoholic hepatitis   Alcoholic gastritis   Alcohol withdrawal syndrome (HCC)   AKI (acute kidney injury) (HCC)   Dehydration   Thrombocytopenia (HCC)   Anemia due to other cause   Hypokalemia   Chronic alcohol abuse    Time spent: 30 minutes     Rexene Alberts, MD  [facility] Pager [phone] If 7PM-7AM, please contact night-coverage at [email or web address], password [facility] 04/27/2015, 12:24 PM  LOS: 3 days

## 2015-04-27 NOTE — Progress Notes (Signed)
5th condom cath applied  Pt continually trying to get out of bed.

## 2015-04-28 LAB — COMPREHENSIVE METABOLIC PANEL
ALBUMIN: 3.3 g/dL — AB (ref 3.5–5.0)
ALT: 91 U/L — ABNORMAL HIGH (ref 17–63)
AST: 145 U/L — AB (ref 15–41)
Alkaline Phosphatase: 173 U/L — ABNORMAL HIGH (ref 38–126)
Anion gap: 9 (ref 5–15)
BUN: 6 mg/dL (ref 6–20)
CO2: 22 mmol/L (ref 22–32)
CREATININE: 0.76 mg/dL (ref 0.61–1.24)
Calcium: 8.8 mg/dL — ABNORMAL LOW (ref 8.9–10.3)
Chloride: 103 mmol/L (ref 101–111)
GFR calc Af Amer: >60 mL/min (ref >60)
GFR calc non Af Amer: >60 mL/min (ref >60)
GLUCOSE: 103 mg/dL — AB (ref 65–99)
POTASSIUM: 3.5 mmol/L (ref 3.5–5.1)
SODIUM: 134 mmol/L — AB (ref 135–145)
Total Bilirubin: 0.8 mg/dL (ref 0.3–1.2)
Total Protein: 6.9 g/dL (ref 6.5–8.1)

## 2015-04-28 LAB — HEPATITIS PANEL, ACUTE
HCV Ab: <0.1 s/co ratio (ref 0.0–0.9)
HEP B C IGM: NEGATIVE
Hep A IgM: NEGATIVE
Hepatitis B Surface Ag: NEGATIVE

## 2015-04-28 LAB — MAGNESIUM: MAGNESIUM: 1.6 mg/dL — AB (ref 1.7–2.4)

## 2015-04-28 MED ORDER — MAGNESIUM SULFATE 2 GM/50ML IV SOLN
2.0000 g | Freq: Once | INTRAVENOUS | Status: AC
Start: 2015-04-28 — End: 2015-04-29
  Administered 2015-04-29: 2 g via INTRAVENOUS
  Filled 2015-04-28: qty 50

## 2015-04-28 MED ORDER — HALOPERIDOL LACTATE 5 MG/ML IJ SOLN
1.0000 mg | Freq: Four times a day (QID) | INTRAMUSCULAR | Status: DC | PRN
  Administered 2015-04-28: 1 mg via INTRAVENOUS
  Filled 2015-04-28: qty 1

## 2015-04-28 MED ORDER — MAGNESIUM SULFATE 50 % IJ SOLN: 2.0000 g | Freq: Once | INTRAVENOUS | Status: DC

## 2015-04-28 MED ORDER — POTASSIUM CHLORIDE CRYS ER 20 MEQ PO TBCR
20.0000 meq | EXTENDED_RELEASE_TABLET | Freq: Two times a day (BID) | ORAL | Status: DC
  Administered 2015-04-29 (×2): 20 meq via ORAL
  Filled 2015-04-28 (×2): qty 1

## 2015-04-28 NOTE — Progress Notes (Signed)
[facility] PROGRESS NOTE  DECKLAN ESCALANTE [id number] DOB: [birth year] DOA: 04/24/2015 PCP: [facility] Summary: 32 yom with history of alcoholism and recent binge drinking over the weekend presented with complaints nausea, vomiting, and abdominal pain. Suspect related to alcoholic gastritis.   Assessment/Plan: Nausea, vomiting, abdominal pain secondary to alcoholic gastritis.   Patient was started on PPI a and GI cocktail. Abdominal US with no evidence of acute cholecystitis. Clear liquid diet was started which he tolerated. It was advanced with good toleration. His GI symptoms have resolved.  Alcoholic hepatitis.  Liver enzymes remain elevated, but are trending downward. Discriminate function is only 17, making benefits from steroids unlikely. -Ultrasound of his abdomen revealed hepatic steatosis, mild gallbladder distention with gallbladder sludge, but no sonographic evidence to suggest acute cholecystitis. Abdomen benign on exam.  -For completion, acute viral hepatitis panel was ordered and was negative.   Alcohol abuse with  alcoholwithdrawal syndrome  reportedly, the patient drinks a fifth of gin weekly. He he did not demonstrate alcohol withdrawal delirium initially, but he did become progressively more agitated and confused , consistent with alcohol withdrawal syndrome.   -CIWA protocol with Ativan and vitamin therapy was ordered on admission. -When necessary Ativan and Haldol ordered. -Clonidine when necessary was ordered for systolic blood pressure of greater than 160 due to its CNS effects. -Clinical social worker consulted to assist with sobriety. -Patient is more oriented today on 04/28/15.  Acute kidney injury secondary to dehydration..  Likely related to dehydration. His renal function has improved progressively. IV fluids were decreased.  Hypokalemia and hypomagnesemia. With initial IV fluids without potassium added, his serum potassium  fell to 3.0. IV fluids were changed to add potassium chloride supplementation. Oral potassium started. IV potassium runs given. Magnesium was assessed and was low. Patient was given 2 g of magnesium sulfate. His serum magnesium and potassium have improved, but still needs supplementation. Will continue.  Suspected alcoholic ketoacidosis. Patient was started on vigorous IV fluids. His acidosis has clinically resolved. IV fluids decreased.  Thrombocytopenia.  Likely related to alcohol abuse; possibly dilutional with vigorous IV fluids on admission. -Vitamin B12 level and TSH ordered for evaluation. Vitamin B12 was within normal limits.Marland Kitchen TSH was within normal limits.  Normocytic anemia,  No evidence of bleeding. Total iron was within normal limits at 55. Ferritin was elevated.  Will continue to monitor. Likely related to alcohol abuse. Continue to follow.  Weakness, wife reported frequent falls and weakness. PT evaluation was canceled secondary to patient's confusion and alcohol withdrawal. He will likely need home PT or short-term skilled nursing facility placement.   Code Status:Full DVT prophylaxis: SCDs Family Communication:  Discussed with daughter Alyse Low on 04/27/15 Disposition Plan:  Discharge when clinically appropriate   Consultants:  None   Procedures:  None   Antibiotics:  None   HPI/Subjective: Patient is sitting up in bed. He asked "how are you". I answered. He denies chest pain or shortness of breath. He does not recall being confused yesterday.  Objective: Filed Vitals:   04/28/15 0657 04/28/15 1200  BP: 138/72 126/80  Pulse: 100 101  Temp: 98.9 F (37.2 C) 98.2 F (36.8 C)  Resp: 18 16  Oxygen saturation  100%  Intake/Output Summary (Last 24 hours) at 04/28/15 1821 Last data filed at 04/28/15 1300  Gross per 24 hour  Intake    720 ml  Output   2175 ml  Net  -1455 ml   Filed Weights   04/24/15  1047 04/24/15 1730  Weight: 58.968 kg (130 lb) 59.875  kg (132 lb)    Exam: General:65 year old man in no acute distress; Cardiovascular: S1, S2, no murmurs rubs or gallops. Respiratory: Decreased breath sounds in the bases, clear anteriorly. Abdomen: Positive bowel sounds, soft, nontender, nondistended. Musculoskeletal: No pedal edema. Neurologic: He is mildly sedated, but oriented to himself, hospital, year, and president (much improved compared to yesterday).  Data Reviewed: Basic Metabolic Panel:  Recent Labs Lab 04/24/15 1111 04/25/15 0637 04/26/15 0634 04/27/15 0653 04/28/15 0611  NA 134* 135 132* 136 134*  K 3.8 3.7 3.0* 3.0* 3.5  CL 82* 99* 98* 101 103  CO2 8* 25 23 25 22   GLUCOSE 163* 169* 90 93 103*  BUN 27* 25* 10 6 6   CREATININE 2.27* 1.34* 0.86 0.73 0.76  CALCIUM 9.0 7.3* 7.6* 8.3* 8.8*  MG  --   --   --  1.1* 1.6*   Liver Function Tests:  Recent Labs Lab 04/24/15 1111 04/25/15 0637 04/26/15 0634 04/28/15 0611  AST 501* 645* 395* 145*  ALT 145* 136* 115* 91*  ALKPHOS 227* 193* 183* 173*  BILITOT 1.5* 1.3* 1.2 0.8  PROT 8.6* 5.8* 5.8* 6.9  ALBUMIN 4.2 2.9* 3.0* 3.3*    Recent Labs Lab 04/24/15 1111  LIPASE 58*   No results for input(s): AMMONIA in the last 168 hours. CBC:  Recent Labs Lab 04/24/15 1111 04/25/15 0637 04/26/15 0634 04/27/15 0653  WBC 7.4 4.1 4.1 4.9  NEUTROABS 5.8  --   --   --   HGB 12.5* 9.7* 9.7* 10.3*  HCT 37.6* 27.4* 28.2* 29.1*  MCV 98.7 92.3 93.1 93.0  PLT 157 91* 76* 74*    Studies: No results found.  Scheduled Meds: . folic acid  1 mg Oral Daily  . multivitamin with minerals  1 tablet Oral Daily  . nicotine  14 mg Transdermal QHS  . pantoprazole  40 mg Oral BID  . sodium chloride flush  3 mL Intravenous Q12H  . tamsulosin  0.4 mg Oral Daily  . thiamine  100 mg Oral Daily   Or  . thiamine  100 mg Intravenous Daily   Continuous Infusions: . 0.9 % NaCl with KCl 40 mEq / L 60 mL/hr (04/28/15 1451)    Principal Problem:   Nausea & vomiting Active  Problems:   Alcoholic hepatitis   Alcoholic gastritis   Alcohol withdrawal syndrome (HCC)   AKI (acute kidney injury) ([place])   Dehydration   Thrombocytopenia (HCC)   Anemia due to other cause   Hypokalemia   Chronic alcohol abuse    Time spent: 30 minutes     Rexene Alberts, MD  [facility] Pager [phone] If 7PM-7AM, please contact night-coverage at [email or web address], password [facility] 04/28/2015, 6:21 PM  LOS: 4 days

## 2015-04-28 NOTE — Care Management Important Message (Signed)
Important Message  Patient Details  Name: [name] MRN: [id number] Date of Birth: 08/19/1950   Medicare Important Message Given:  Yes    Alvie Heidelberg, RN 04/28/2015, 8:45 AM

## 2015-04-29 LAB — CBC
HEMATOCRIT: 31.5 % — AB (ref 39.0–52.0)
HEMOGLOBIN: 10.9 g/dL — AB (ref 13.0–17.0)
MCH: 32.6 pg (ref 26.0–34.0)
MCHC: 34.6 g/dL (ref 30.0–36.0)
MCV: 94.3 fL (ref 78.0–100.0)
Platelets: 122 10*3/uL — ABNORMAL LOW (ref 150–400)
RBC: 3.34 MIL/uL — AB (ref 4.22–5.81)
RDW: 18.4 % — ABNORMAL HIGH (ref 11.5–15.5)
WBC: 4.3 10*3/uL (ref 4.0–10.5)

## 2015-04-29 LAB — BASIC METABOLIC PANEL
Anion gap: 7 (ref 5–15)
BUN: 8 mg/dL (ref 6–20)
CHLORIDE: 106 mmol/L (ref 101–111)
CO2: 22 mmol/L (ref 22–32)
Calcium: 8.7 mg/dL — ABNORMAL LOW (ref 8.9–10.3)
Creatinine, Ser: 0.82 mg/dL (ref 0.61–1.24)
GFR calc Af Amer: >60 mL/min (ref >60)
GFR calc non Af Amer: >60 mL/min (ref >60)
GLUCOSE: 90 mg/dL (ref 65–99)
POTASSIUM: 4 mmol/L (ref 3.5–5.1)
Sodium: 135 mmol/L (ref 135–145)

## 2015-04-29 LAB — C DIFFICILE QUICK SCREEN W PCR REFLEX
C DIFFICILE (CDIFF) INTERP: NEGATIVE
C DIFFICILE (CDIFF) TOXIN: NEGATIVE
C Diff antigen: NEGATIVE

## 2015-04-29 LAB — MAGNESIUM: Magnesium: 2 mg/dL (ref 1.7–2.4)

## 2015-04-29 MED ORDER — KCL IN DEXTROSE-NACL 20-5-0.9 MEQ/L-%-% IV SOLN
INTRAVENOUS | Status: DC
  Administered 2015-04-29 – 2015-04-30 (×2): via INTRAVENOUS

## 2015-04-29 MED ORDER — POTASSIUM CHLORIDE CRYS ER 10 MEQ PO TBCR
10.0000 meq | EXTENDED_RELEASE_TABLET | Freq: Two times a day (BID) | ORAL | Status: DC
  Administered 2015-04-29 – 2015-05-01 (×4): 10 meq via ORAL
  Filled 2015-04-29 (×4): qty 1
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
## 2015-04-30 DIAGNOSIS — F10230 Alcohol dependence with withdrawal, uncomplicated: Secondary | ICD-10-CM

## 2015-04-30 NOTE — Progress Notes (Signed)
[facility] PROGRESS NOTE  Adam Flynn [id number] DOB: 06-Nov-1950 DOA: 04/24/2015 PCP: [facility] Summary: 65 yo man with history of alcoholism and recent binge drinking prior to hospital admission presented with complaints nausea, vomiting, and abdominal pain.  Etiology presumed to be alcoholic gastritis.   Assessment/Plan: Nausea, vomiting, abdominal pain secondary to alcoholic gastritis.   On admission, patient's  lipase was marginally elevated at 69, not clinically pancreatitis. Patient was started on PPI a and GI cocktail. Abdominal US ordered for evaluation and revealed no evidence of acute cholecystitis. Clear liquid diet was started which he tolerated.  His diet was advanced with good tolerance. His GI symptoms resolved.  Alcoholic hepatitis.  Liver enzymes  Were elevated on admission with an AST of 501 , ALT of 145, and total bilirubin of 1.5. Discriminate function is only 17, making benefits from steroids unlikely. Patient was started on alcohol cessation and  IV fluids for hydration. -Ultrasound of his abdomen revealed hepatic steatosis, mild gallbladder distention with gallbladder sludge, but no sonographic evidence to suggest acute cholecystitis.  His abdominal exam has been benign.  His LFTs have trended downward. -For completion, acute viral hepatitis panel was ordered and it was negative.   Alcohol abuse with  alcoholwithdrawal syndrome  reportedly, the patient drinks a fifth of gin weekly. He he did not demonstrate alcohol withdrawal delirium initially, but he did become progressively more agitated and confused , consistent with alcohol withdrawal syndrome.   -CIWA protocol with Ativan and vitamin therapy was ordered on admission. -When necessary Ativan and Haldol were ordered. -Clonidine when necessary was ordered for systolic blood pressure of greater than 160 due to its CNS effects. -Clinical social worker consulted to assist with  sobriety. - Overall, his alcohol withdrawal syndrome  is approaching resolution  Acute kidney injury secondary to dehydration..  Likely related to dehydration. His renal function improved progressively. IV fluids were decreased.  Hypokalemia and hypomagnesemia. With initial IV fluids without potassium added, his serum potassium fell to 3.0. IV fluids were changed to add potassium chloride supplementation. Oral potassium started. IV potassium runs given. Magnesium was assessed and was low. Patient was given a total of 4g of magnesium sulfate. His serum magnesium and potassium have improved. Will continue to supplement accordingly.  Suspected alcoholic ketoacidosis. Patient was started on vigorous IV fluids. His acidosis resolved. IV fluids decreased.  Thrombocytopenia.  Likely related to alcohol abuse; possibly dilutional with vigorous IV fluids on admission. -Vitamin B12 level and TSH ordered for evaluation. Vitamin B12 was within normal limits.Marland Kitchen TSH was within normal limits. His platelet count has improved.  Normocytic anemia,  No evidence of bleeding. Total iron was within normal limits at 55. Ferritin was elevated. Etiology of his anemia is likely from alcohol abuse or possible alcoholic gastritis. Continue to follow.  Weakness, wife reported frequent falls and weakness at home. PT evaluation was canceled secondary to patient's confusion and alcohol withdrawal. He will likely need home PT or short-term skilled nursing facility placement.  - Will await PT evaluation. Patient's cognition is better.    Code Status:Full DVT prophylaxis: SCDs Family Communication:  Discussed with daughter Alyse Low on 04/27/15. Called home number but there is no answer on 04/30/15. Disposition Plan:  Discharge when clinically appropriate; Likely in 24 hours to home with PT versus SNF.   Consultants:  None   Procedures:  None   Antibiotics:  None   HPI/Subjective:  Patient is sitting up in bed  eating lunch. Overall,  he says that he is feeling much better. He vows to stop drinking.  Objective: Filed Vitals:   04/29/15 2230 04/30/15 0509  BP: 118/75 123/72  Pulse: 91 81  Temp: 99 F (37.2 C) 98.6 F (37 C)  Resp: 20 20  Oxygen saturation  100%  Intake/Output Summary (Last 24 hours) at 04/30/15 1416 Last data filed at 04/30/15 0509  Gross per 24 hour  Intake    240 ml  Output   1700 ml  Net  -1460 ml   Filed Weights   04/24/15 1047 04/24/15 1730  Weight: 58.968 kg (130 lb) 59.875 kg (132 lb)    Exam: General:65 year old man in no acute distress; Overall, he looks much better Cardiovascular: S1, S2, no murmurs rubs or gallops. Respiratory: Decreased breath sounds in the bases, clear anteriorly. Abdomen: Positive bowel sounds, soft, nontender, nondistended. Musculoskeletal: No pedal edema. Neurologic: He is alert but oriented to himself, hospital, and president. Overall, his speech is clear, he is no longer tremulous or confused,but he appears deconditioned.  Data Reviewed: Basic Metabolic Panel:  Recent Labs Lab 04/25/15 0637 04/26/15 0634 04/27/15 0653 04/28/15 0611 04/29/15 0634  NA 135 132* 136 134* 135  K 3.7 3.0* 3.0* 3.5 4.0  CL 99* 98* 101 103 106  CO2 25 23 25 22 22   GLUCOSE 169* 90 93 103* 90  BUN 25* 10 6 6 8   CREATININE 1.34* 0.86 0.73 0.76 0.82  CALCIUM 7.3* 7.6* 8.3* 8.8* 8.7*  MG  --   --  1.1* 1.6* 2.0   Liver Function Tests:  Recent Labs Lab 04/24/15 1111 04/25/15 0637 04/26/15 0634 04/28/15 0611  AST 501* 645* 395* 145*  ALT 145* 136* 115* 91*  ALKPHOS 227* 193* 183* 173*  BILITOT 1.5* 1.3* 1.2 0.8  PROT 8.6* 5.8* 5.8* 6.9  ALBUMIN 4.2 2.9* 3.0* 3.3*    Recent Labs Lab 04/24/15 1111  LIPASE 58*   No results for input(s): AMMONIA in the last 168 hours. CBC:  Recent Labs Lab 04/24/15 1111 04/25/15 0637 04/26/15 0634 04/27/15 0653 04/29/15 0634  WBC 7.4 4.1 4.1 4.9 4.3  NEUTROABS 5.8  --   --   --   --   HGB  12.5* 9.7* 9.7* 10.3* 10.9*  HCT 37.6* 27.4* 28.2* 29.1* 31.5*  MCV 98.7 92.3 93.1 93.0 94.3  PLT 157 91* 76* 74* 122*    Studies: No results found.  Scheduled Meds: . folic acid  1 mg Oral Daily  . multivitamin with minerals  1 tablet Oral Daily  . nicotine  14 mg Transdermal QHS  . pantoprazole  40 mg Oral BID  . potassium chloride  10 mEq Oral BID  . sodium chloride flush  3 mL Intravenous Q12H  . tamsulosin  0.4 mg Oral Daily  . thiamine  100 mg Oral Daily   Or  . thiamine  100 mg Intravenous Daily   Continuous Infusions: . dextrose 5 % and 0.9 % NaCl with KCl 20 mEq/L 50 mL/hr at 04/30/15 [id number]    Principal Problem:   Nausea & vomiting Active Problems:   Alcoholic hepatitis   Alcoholic gastritis   Alcohol withdrawal syndrome (HCC)   AKI (acute kidney injury) (HCC)   Dehydration   Thrombocytopenia (HCC)   Anemia due to other cause   Hypokalemia   Chronic alcohol abuse    Time spent: 25 minutes     Rexene Alberts, MD  [facility] Pager [phone] If 7PM-7AM, please contact night-coverage at [email or web address], password [facility]  04/30/2015, 2:16 PM  LOS: 6 days

## 2015-05-01 DIAGNOSIS — R945 Abnormal results of liver function studies: Secondary | ICD-10-CM

## 2015-05-01 DIAGNOSIS — R7989 Other specified abnormal findings of blood chemistry: Secondary | ICD-10-CM

## 2015-05-01 DIAGNOSIS — F1010 Alcohol abuse, uncomplicated: Secondary | ICD-10-CM | POA: Insufficient documentation

## 2015-05-01 MED ORDER — PANTOPRAZOLE SODIUM 20 MG PO TBEC
20.0000 mg | DELAYED_RELEASE_TABLET | Freq: Every day | ORAL | Status: DC
Start: 2015-05-01 — End: 2018-03-27

## 2015-05-01 NOTE — Discharge Summary (Signed)
Physician Discharge Summary  Adam Flynn [id number] DOB: [birth year] DOA: 04/24/2015  PCP: [facility]  Admit date: 04/24/2015 Discharge date: 05/01/2015  Time spent: Greater than 30 minutes  Recommendations for Outpatient Follow-up:  1. Patient was strongly advised to stop drinking alcohol completely.    Discharge Diagnoses:  1. Nausea, vomiting, and abdominal pain, secondary to alcoholic gastritis. 2. Elevated LFTs secondary to alcoholic hepatitis and steatosis. 3. Alcohol abuse with alcohol withdrawal syndrome. 4. Acute kidney injury secondary to dehydration/prerenal azotemia. 5. Hypokalemia and hypomagnesemia. 6 suspected alcoholic ketoacidosis. 7. Thrombocytopenia, likely secondary to alcohol abuse. 8. Normocytic anemia, likely from alcohol abuse or possible alcoholic gastritis. 9. Generalized weakness, resolved.   Discharge Condition: Improved.  Diet recommendation: Heart healthy.  Filed Weights   04/24/15 1047 04/24/15 1730  Weight: 58.968 kg (130 lb) 59.875 kg (132 lb)    History of present illness:  Patient is a 65 year old man with a history of alcoholism, who presented to the ED on 04/24/2015 after binge drinking over the weekend prior to admission. He had been drinking both gin and wine. He developed nausea, vomiting, and diffuse abdominal pain. He also became dizzy on standing. In the ED, he was afebrile and modestly tachycardic. His blood pressure was within normal limits. His lab data were significant for serum sodium of 134, creatinine of 2.27, glucose of 163, lipase of 58, AST of 501, ALT of 145, total bilirubin 1.5, CO2 of 8, alcohol level of 21, and negative urine drug screen. CT of his abdomen and pelvis revealed no acute findings including no evidence of pancreatitis, but with severe hepatic steatosis and a distended gallbladder. He was admitted for further evaluation and management.     Hospital Course:  Nausea, vomiting,  abdominal pain secondary to alcoholic gastritis.  On admission, patient's lipase was marginally elevated at 77, not clinically pancreatitis. Patient was started on vigorous IV fluids, IV Protonix, and given a GI cocktail. CT of his abdomen and pelvis revealed no evidence of acute pancreatitis, but possible gallbladder distention. Abdominal US ordered for evaluation and revealed no evidence of acute cholecystitis. Clear liquid diet was started which he tolerated. His diet was advanced with good tolerance. His GI symptoms resolved. Protonix was changed to by mouth.  Alcoholic hepatitis.  Liver enzymeswere elevated on admission with an AST of 501 , ALT of 145, and total bilirubin of 1.5. Discriminate function was only 17, making benefits from steroids unlikely. Patient was started on alcohol cessation and IV fluids for hydration. -Ultrasound of his abdomen revealed hepatic steatosis, mild gallbladder distention with gallbladder sludge, but no sonographic evidence to suggest acute cholecystitis. His abdominal pain resolved and his abdominal exam became benign. His LFTs trended downward. -For completion, acute viral hepatitis panel was ordered and it was negative.  Alcohol abuse with alcohol withdrawal syndrome Reportedly, the patient drinks a fifth of gin along with bottles of wine weekly. He he did not demonstrate alcohol withdrawal delirium initially, but he did become progressively more agitated and confused , consistent with alcohol withdrawal syndrome.  -CIWA protocol with Ativan and vitamin therapy was ordered on admission. -When necessary Ativan and Haldol was added. -Clonidine when necessary was ordered for systolic blood pressure of greater than 160 due to its CNS effects. -Clinical social worker consulted to assist with sobriety. - Alcohol withdrawal syndrome completely resolved. The patient reported that he will stop drinking indefinitely.  Acute kidney injury secondary to  dehydration..  Patient's creatinine was greater than 2  on admission. He had no prior history of chronic kidney disease. He was started on vigorous IV fluids. His renal function improved progressively. His creatinine was within normal limits at discharge.   Hypokalemia and hypomagnesemia. With initial IV fluids without potassium added, his serum potassium fell to 3.0. IV fluids were changed to add potassium chloride supplementation. Oral potassium started. IV potassium runs given. Magnesium was assessed and was low. Patient was given a total of 4g of magnesium sulfate. His serum magnesium and potassium improved.  Suspected alcoholic ketoacidosis. Patient CO2 was 8 on admission. He was was started on vigorous IV fluids. His acidosis resolved. Etiology was thought to be secondary to alcoholic ketoacidosis.  Thrombocytopenia. Patient's platelet count was within normal limits on admission. However, with hydration, it fell to a nadir of 74, likely related to alcohol abuse and dilutional with vigorous IV fluids on admission. -Vitamin B12 level and TSH ordered for evaluation. Vitamin B12 was within normal limits.Marland Kitchen TSH was within normal limits. His platelet count has improved to 122 at the time of discharge.  Normocytic anemia,  Patient's hemoglobin was 12.5 on admission. With vigorous IV fluids, it fell to 9.7. There was no evidence of bleeding. Total iron was within normal limits at 55. Ferritin was elevated. Etiology of his anemia was likely from alcohol abuse or possible alcoholic gastritis in the setting of dilutional effects of IV fluids. He was discharged on a multivitamin.    Procedures:  None  Consultations:  None  Discharge Exam: Filed Vitals:   04/30/15 1509 05/01/15 0526  BP: 128/70 120/71  Pulse: 84 74  Temp: 98.2 F (36.8 C) 98 F (36.7 C)  Resp: 18 16    General: Pleasant alert 65 year old man in no acute distress. He looks much better. Cardiovascular: S1, S2, no murmurs  rubs or gallops. Respiratory: Clear to auscultation bilaterally. Abdomen: Positive bowel sounds, soft, nontender, nondistended.  Discharge Instructions   Discharge Instructions    Diet - low sodium heart healthy    Complete by:  As directed      Discharge instructions    Complete by:  As directed   Avoid drinking alcohol.     Increase activity slowly    Complete by:  As directed           Current Discharge Medication List    START taking these medications   Details  pantoprazole (PROTONIX) 20 MG tablet Take 1 tablet (20 mg total) by mouth daily. Qty: 30 tablet, Refills: 3      CONTINUE these medications which have NOT CHANGED   Details  cetirizine (ZYRTEC) 10 MG tablet Take 10 mg by mouth daily. Refills: 6    fluticasone (VERAMYST) 27.5 MCG/SPRAY nasal spray Place 2 sprays into the nose daily.    Multiple Vitamin (ONE-A-DAY MENS PO) Take 1 tablet by mouth daily.    tamsulosin (FLOMAX) 0.4 MG CAPS capsule Take 0.4 mg by mouth daily.        Allergies  Allergen Reactions  . Asa [Aspirin]     Makes stomach burn   Follow-up Information    Follow up with [facility] On 05/15/2015.   Why:  at 10:30   Contact information:   [address] [place] [address] [phone]        The results of significant diagnostics from this hospitalization (including imaging, microbiology, ancillary and laboratory) are listed below for reference.    Significant Diagnostic Studies: Ct Abdomen Pelvis Wo Contrast  04/24/2015  CLINICAL DATA:  Abdominal pain with nausea and vomiting today. Recent heavy alcohol ingestion. EXAM: CT ABDOMEN AND PELVIS WITHOUT CONTRAST TECHNIQUE: Multidetector CT imaging of the abdomen and pelvis was performed following the standard protocol without IV contrast. COMPARISON:  None. FINDINGS: Lower chest: Mild dependent atelectasis at both lung bases. No significant pleural or pericardial effusion. There is a small hiatal hernia with  distal esophageal wall thickening. Hepatobiliary: Severe hepatic steatosis. No focal lesions identified on noncontrast imaging. Gallbladder evaluation is mildly limited by breathing artifact. The gallbladder is distended with intermediate density bile. No calcified gallstones or definite surrounding inflammation identified. Pancreas: Unremarkable. No pancreatic ductal dilatation or surrounding inflammatory changes. Spleen: Normal in size without focal abnormality. Adrenals/Urinary Tract: Both adrenal glands appear normal. Both kidneys appear unremarkable. The urinary tract calculus or hydronephrosis. There is no perinephric soft tissue stranding. The bladder appears unremarkable. Stomach/Bowel: No evidence of bowel wall thickening, distention or surrounding inflammatory change. The appendix appears normal. Vascular/Lymphatic: There are multiple small calcified lymph nodes within the porta hepatis. No enlarged abdominal pelvic lymph nodes are identified. Mild aortic and branch vessel atherosclerosis. Reproductive: The prostate gland is mildly enlarged with central dystrophic calcifications. There is asymmetric enlargement of the right seminal vesicle which also contains calcifications. Other: No ascites or free intraperitoneal air. The anterior abdominal wall appears unremarkable. Musculoskeletal: No acute or significant osseous findings. Mild lumbar spondylosis. There is a synovial herniation pit anteriorly in the right femoral neck. IMPRESSION: 1. No definite acute findings.  No evidence of pancreatitis. 2. Severe hepatic steatosis. 3. The gallbladder is distended with intermediate density bile. No calcified gallstone or obvious pericholecystic inflammation. Ultrasound may be helpful for further evaluation if there is concern of gallbladder pathology. 4. Calcified lymph nodes in the porta hepatis consistent with prior inflammation. 5. Mild distal esophageal wall thickening. 6. Mild atherosclerosis. 7.  Calcifications in the prostate gland and seminal vesicles. Electronically Signed   By: Richardean Sale M.D.   On: 04/24/2015 13:22   US Abdomen Limited Ruq  04/25/2015  CLINICAL DATA:  Elevated LFTs with nausea and vomiting. ETOH 30 years. Acute renal failure. EXAM: US ABDOMEN LIMITED - RIGHT UPPER QUADRANT COMPARISON:  CT 04/24/2015 FINDINGS: Gallbladder: Gallbladder somewhat distended with moderate amount of sludge. No evidence of gallstones or wall thickening. No pericholecystic fluid. Negative sonographic Murphy sign. Common bile duct: Diameter: 6.0 mm. Liver: Evidence of mild diffuse hepatic steatosis without focal mass. Tiny amount of free fluid adjacent the left lobe of the liver. IMPRESSION: Mild gallbladder distension with moderate amount of gallbladder sludge. No additional sonographic evidence to suggest acute cholecystitis. Hepatic steatosis.  Trace amount of ascites. Electronically Signed   By: Marin Olp M.D.   On: 04/25/2015 08:13    Microbiology: Recent Results (from the past 240 hour(s))  C difficile quick scan w PCR reflex     Status: None   Collection Time: 04/29/15  5:20 PM  Result Value Ref Range Status   C Diff antigen NEGATIVE NEGATIVE Final   C Diff toxin NEGATIVE NEGATIVE Final   C Diff interpretation Negative for toxigenic C. difficile  Final     Labs: Basic Metabolic Panel:  Recent Labs Lab 04/25/15 0637 04/26/15 0634 04/27/15 0653 04/28/15 0611 04/29/15 0634  NA 135 132* 136 134* 135  K 3.7 3.0* 3.0* 3.5 4.0  CL 99* 98* 101 103 106  CO2 25 23 25 22 22   GLUCOSE 169* 90 93 103* 90  BUN 25* 10 6 6  8  CREATININE 1.34* 0.86 0.73 0.76 0.82  CALCIUM 7.3* 7.6* 8.3* 8.8* 8.7*  MG  --   --  1.1* 1.6* 2.0   Liver Function Tests:  Recent Labs Lab 04/25/15 0637 04/26/15 0634 04/28/15 0611  AST 645* 395* 145*  ALT 136* 115* 91*  ALKPHOS 193* 183* 173*  BILITOT 1.3* 1.2 0.8  PROT 5.8* 5.8* 6.9  ALBUMIN 2.9* 3.0* 3.3*   No results for input(s): LIPASE,  AMYLASE in the last 168 hours. No results for input(s): AMMONIA in the last 168 hours. CBC:  Recent Labs Lab 04/25/15 0637 04/26/15 0634 04/27/15 0653 04/29/15 0634  WBC 4.1 4.1 4.9 4.3  HGB 9.7* 9.7* 10.3* 10.9*  HCT 27.4* 28.2* 29.1* 31.5*  MCV 92.3 93.1 93.0 94.3  PLT 91* 76* 74* 122*   Cardiac Enzymes: No results for input(s): CKTOTAL, CKMB, CKMBINDEX, TROPONINI in the last 168 hours. BNP: BNP (last 3 results) No results for input(s): BNP in the last 8760 hours.  ProBNP (last 3 results) No results for input(s): PROBNP in the last 8760 hours.  CBG: No results for input(s): GLUCAP in the last 168 hours.     Signed:  Taydem Cavagnaro MD.  [facility] 05/01/2015, 2:34 PM

## 2015-05-01 NOTE — Care Management Important Message (Signed)
Important Message  Patient Details  Name: Adam Flynn MRN: [id number] Date of Birth: 1950-08-15   Medicare Important Message Given:  Yes    Alvie Heidelberg, RN 05/01/2015, 3:09 PM

## 2015-05-01 NOTE — Progress Notes (Signed)
Physical Therapy Treatment Patient Details Name: Adam Flynn MRN: [id number] DOB: 08/06/1950 Today's Date: 05/01/2015    History of Present Illness Adam Flynn is a 65yo black male who comes to [facility] c N/V ABD pain and low back pain, all after an episode of binge drinking alcohol. PMH: ETOH abuse, BPH, R eye prosthesis, L peripheral visual deficits. Wife reports pt has a chronic history of binge drinking, and has been falling often at home. Per MD report, pt underwent strong DTs after PT eval.    PT Comments    Pt tolerating treatment session well, motivated and able to complete entire PT sesssion as planned. Pt continues to make progress toward goals as evidenced by ambulation distance 4x greater, lower level of assistance, and significantly improved gait speed. Pt's greatest limitation continues to be mild instability in gait, only noted after attempt of 20 stairs. Patient presenting with impairment of strength, balance, and activity tolerance, limiting ability to perform mobility tasks at baseline level of function. Patient will benefit from skilled intervention to address the above impairments and limitations, in order to restore to prior level of function, improve patient safety upon discharge, and to decrease caregiver burden.    Follow Up Recommendations  Outpatient PT     Equipment Recommendations  None recommended by PT    Recommendations for Other Services       Precautions / Restrictions Precautions Precautions: Fall Restrictions Weight Bearing Restrictions: No    Mobility  Bed Mobility Overal bed mobility: Independent                Transfers Overall transfer level: Independent               General transfer comment: 5x STS 9s, hands free.   Ambulation/Gait Ambulation/Gait assistance: Independent Ambulation Distance (Feet): 600 Feet Assistive device: None Gait Pattern/deviations: WFL(Within Functional Limits) Gait velocity: 1.73m/s (previous  .28m/s at eval)        Stairs Stairs: Yes Stairs assistance: Supervision Stair Management: One rail Right Number of Stairs: 20 General stair comments: catches foot on step 16, but is able to self stabilize.   Wheelchair Mobility    Modified Rankin (Stroke Patients Only)       Balance Overall balance assessment: No apparent balance deficits (not formally assessed)                                  Cognition Arousal/Alertness: Awake/alert Behavior During Therapy: WFL for tasks assessed/performed Overall Cognitive Status: Within Functional Limits for tasks assessed                      Exercises      General Comments        Pertinent Vitals/Pain Pain Assessment: No/denies pain    Home Living                      Prior Function            PT Goals (current goals can now be found in the care plan section) Acute Rehab PT Goals Patient Stated Goal: imrpove cogntiion, balance, indep, stop drinking.  PT Goal Formulation: With family Time For Goal Achievement: 05/09/15 Potential to Achieve Goals: Good Progress towards PT goals: Progressing toward goals    Frequency  Min 3X/week    PT Plan Current plan remains appropriate    Co-evaluation  End of Session Equipment Utilized During Treatment: Gait belt Activity Tolerance: Patient tolerated treatment well;No increased pain Patient left: in bed;with call bell/phone within reach;with family/visitor present     Time: 1411-1420 PT Time Calculation (min) (ACUTE ONLY): 9 min  Charges:  $Therapeutic Activity: 8-22 mins                    G Codes:      2:38 PM, 05-22-2015 Etta Grandchild, PT, DPT PRN [facility] License # [id number] (wireless)  [phone] (mobile)

## 2016-12-25 ENCOUNTER — Encounter (HOSPITAL_COMMUNITY): Payer: Self-pay | Admitting: *Deleted

## 2016-12-25 ENCOUNTER — Other Ambulatory Visit: Payer: Self-pay

## 2016-12-25 ENCOUNTER — Emergency Department (HOSPITAL_COMMUNITY)
Admission: EM | Admit: 2016-12-25 | Discharge: 2016-12-25 | Disposition: A | Discharge status: Home / Self Care | Payer: Medicare HMO | Attending: Emergency Medicine | Admitting: Emergency Medicine

## 2016-12-25 DIAGNOSIS — J449 Chronic obstructive pulmonary disease, unspecified: Secondary | ICD-10-CM | POA: Insufficient documentation

## 2016-12-25 DIAGNOSIS — Z87891 Personal history of nicotine dependence: Secondary | ICD-10-CM | POA: Insufficient documentation

## 2016-12-25 DIAGNOSIS — Z79899 Other long term (current) drug therapy: Secondary | ICD-10-CM | POA: Diagnosis not present

## 2016-12-25 DIAGNOSIS — R109 Unspecified abdominal pain: Secondary | ICD-10-CM | POA: Diagnosis present

## 2016-12-25 HISTORY — DX: Chronic obstructive pulmonary disease, unspecified: J44.9

## 2016-12-25 HISTORY — DX: Pure hypercholesterolemia, unspecified: E78.00

## 2016-12-25 HISTORY — DX: Other pulmonary collapse: J98.19

## 2016-12-25 HISTORY — DX: Tachycardia, unspecified: R00.0

## 2016-12-25 LAB — URINALYSIS, ROUTINE W REFLEX MICROSCOPIC
Bacteria, UA: NONE SEEN
Bilirubin Urine: NEGATIVE
GLUCOSE, UA: NEGATIVE mg/dL
Hgb urine dipstick: NEGATIVE
KETONES UR: NEGATIVE mg/dL
LEUKOCYTES UA: NEGATIVE
Nitrite: NEGATIVE
PH: 6 (ref 5.0–8.0)
Protein, ur: NEGATIVE mg/dL
Specific Gravity, Urine: 1.023 (ref 1.005–1.030)

## 2016-12-25 LAB — CBC WITH DIFFERENTIAL/PLATELET
BASOS ABS: 0 10*3/uL (ref 0.0–0.1)
BASOS PCT: 0 %
EOS ABS: 0.1 10*3/uL (ref 0.0–0.7)
Eosinophils Relative: 1 %
HEMATOCRIT: 38.5 % — AB (ref 39.0–52.0)
HEMOGLOBIN: 12.3 g/dL — AB (ref 13.0–17.0)
Lymphocytes Relative: 35 %
Lymphs Abs: 2.5 10*3/uL (ref 0.7–4.0)
MCH: 31.1 pg (ref 26.0–34.0)
MCHC: 31.9 g/dL (ref 30.0–36.0)
MCV: 97.2 fL (ref 78.0–100.0)
MONO ABS: 0.5 10*3/uL (ref 0.1–1.0)
Monocytes Relative: 7 %
NEUTROS ABS: 4.2 10*3/uL (ref 1.7–7.7)
NEUTROS PCT: 57 %
PLATELETS: 283 10*3/uL (ref 150–400)
RBC: 3.96 MIL/uL — ABNORMAL LOW (ref 4.22–5.81)
RDW: 13.4 % (ref 11.5–15.5)
WBC: 7.3 10*3/uL (ref 4.0–10.5)

## 2016-12-25 LAB — BASIC METABOLIC PANEL
ANION GAP: 6 (ref 5–15)
BUN: 15 mg/dL (ref 6–20)
CALCIUM: 10 mg/dL (ref 8.9–10.3)
CO2: 29 mmol/L (ref 22–32)
Chloride: 106 mmol/L (ref 101–111)
Creatinine, Ser: 1.08 mg/dL (ref 0.61–1.24)
Glucose, Bld: 92 mg/dL (ref 65–99)
Potassium: 4.4 mmol/L (ref 3.5–5.1)
Sodium: 141 mmol/L (ref 135–145)
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
## 2016-12-25 NOTE — Discharge Instructions (Signed)
Take Tylenol or Advil for pain directed.  If you are still having significant pain in 10-14 days, call your primary care physician to see him.  Return if you develop fever, lightheadedness, pain worsens or if concern for any reason

## 2016-12-25 NOTE — ED Triage Notes (Addendum)
Pt c/o suprapubic pain, increased urinary frequency x 1-2 months. Pt was seen at [facility] ED and was told he had an enlarged, infected prostate and given Flomax and antibiotics. Pt has finished the antibiotics and still taking the Flomax with minimal relief of pain. Pt c/o left flank pain x 4 days. Denies dysuria, hematuria, fever.

## 2018-03-20 ENCOUNTER — Ambulatory Visit (HOSPITAL_COMMUNITY): Payer: Medicare HMO | Admitting: Hematology

## 2018-03-27 ENCOUNTER — Other Ambulatory Visit: Payer: Self-pay

## 2018-03-27 ENCOUNTER — Inpatient Hospital Stay (HOSPITAL_COMMUNITY): Payer: Medicare HMO | Attending: Hematology | Admitting: Internal Medicine

## 2018-03-27 ENCOUNTER — Inpatient Hospital Stay (HOSPITAL_COMMUNITY): Payer: Medicare HMO

## 2018-03-27 ENCOUNTER — Encounter (HOSPITAL_COMMUNITY): Payer: Self-pay | Admitting: Internal Medicine

## 2018-03-27 VITALS — BP 134/64 | HR 81 | Temp 98.5°F | Resp 18 | Wt 134.9 lb

## 2018-03-27 DIAGNOSIS — Z808 Family history of malignant neoplasm of other organs or systems: Secondary | ICD-10-CM | POA: Diagnosis not present

## 2018-03-27 DIAGNOSIS — Z9181 History of falling: Secondary | ICD-10-CM | POA: Diagnosis not present

## 2018-03-27 DIAGNOSIS — D649 Anemia, unspecified: Secondary | ICD-10-CM | POA: Diagnosis present

## 2018-03-27 DIAGNOSIS — Z87891 Personal history of nicotine dependence: Secondary | ICD-10-CM | POA: Diagnosis not present

## 2018-03-27 DIAGNOSIS — Z801 Family history of malignant neoplasm of trachea, bronchus and lung: Secondary | ICD-10-CM

## 2018-03-27 DIAGNOSIS — J449 Chronic obstructive pulmonary disease, unspecified: Secondary | ICD-10-CM

## 2018-03-27 DIAGNOSIS — K5900 Constipation, unspecified: Secondary | ICD-10-CM | POA: Diagnosis not present

## 2018-03-27 LAB — COMPREHENSIVE METABOLIC PANEL
ALT: 16 U/L (ref 0–44)
AST: 23 U/L (ref 15–41)
Albumin: 4.2 g/dL (ref 3.5–5.0)
Alkaline Phosphatase: 54 U/L (ref 38–126)
Anion gap: 7 (ref 5–15)
BUN: 15 mg/dL (ref 8–23)
CHLORIDE: 106 mmol/L (ref 98–111)
CO2: 28 mmol/L (ref 22–32)
CREATININE: 1.12 mg/dL (ref 0.61–1.24)
Calcium: 9.5 mg/dL (ref 8.9–10.3)
GFR calc Af Amer: >60 mL/min (ref >60)
GFR calc non Af Amer: >60 mL/min (ref >60)
Glucose, Bld: 83 mg/dL (ref 70–99)
Potassium: 3.9 mmol/L (ref 3.5–5.1)
Sodium: 141 mmol/L (ref 135–145)
Total Bilirubin: 0.4 mg/dL (ref 0.3–1.2)
Total Protein: 7.5 g/dL (ref 6.5–8.1)

## 2018-03-27 LAB — CBC WITH DIFFERENTIAL/PLATELET
ABS IMMATURE GRANULOCYTES: 0.01 10*3/uL (ref 0.00–0.07)
BASOS PCT: 0 %
Basophils Absolute: 0 10*3/uL (ref 0.0–0.1)
Eosinophils Absolute: 0.1 10*3/uL (ref 0.0–0.5)
Eosinophils Relative: 2 %
HEMATOCRIT: 32.8 % — AB (ref 39.0–52.0)
HEMOGLOBIN: 10.4 g/dL — AB (ref 13.0–17.0)
IMMATURE GRANULOCYTES: 0 %
LYMPHS ABS: 1.8 10*3/uL (ref 0.7–4.0)
LYMPHS PCT: 32 %
MCH: 31.3 pg (ref 26.0–34.0)
MCHC: 31.7 g/dL (ref 30.0–36.0)
MCV: 98.8 fL (ref 80.0–100.0)
MONOS PCT: 7 %
Monocytes Absolute: 0.4 10*3/uL (ref 0.1–1.0)
NEUTROS ABS: 3.3 10*3/uL (ref 1.7–7.7)
NEUTROS PCT: 59 %
PLATELETS: 258 10*3/uL (ref 150–400)
RBC: 3.32 MIL/uL — ABNORMAL LOW (ref 4.22–5.81)
RDW: 13.1 % (ref 11.5–15.5)
WBC: 5.5 10*3/uL (ref 4.0–10.5)
nRBC: 0 % (ref 0.0–0.2)

## 2018-03-27 LAB — LACTATE DEHYDROGENASE: LDH: 122 U/L (ref 98–192)

## 2018-03-27 LAB — FERRITIN: FERRITIN: 26 ng/mL (ref 24–336)

## 2018-03-27 LAB — FOLATE: Folate: 18.8 ng/mL (ref >5.9)

## 2018-03-27 LAB — VITAMIN B12: Vitamin B-12: 321 pg/mL (ref 180–914)
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
## 2018-03-27 NOTE — Patient Instructions (Signed)
[facility] at [facility] Discharge Instructions  You were seen by Dr. Higgs today   Thank you for choosing [facility] at [facility] to provide your oncology and hematology care.  To afford each patient quality time with our provider, please arrive at least 15 minutes before your scheduled appointment time.   If you have a lab appointment with the Cancer Center please come in thru the  Main Entrance and check in at the main information desk  You need to re-schedule your appointment should you arrive 10 or more minutes late.  We strive to give you quality time with our providers, and arriving late affects you and other patients whose appointments are after yours.  Also, if you no show three or more times for appointments you may be dismissed from the clinic at the providers discretion.     Again, thank you for choosing [facility].  Our hope is that these requests will decrease the amount of time that you wait before being seen by our physicians.       _____________________________________________________________  Should you have questions after your visit to [facility], please contact our office at [phone] between the hours of 8:00 a.m. and 4:30 p.m.  Voicemails left after 4:00 p.m. will not be returned until the following business day.  For prescription refill requests, have your pharmacy contact our office and allow 72 hours.    Cancer Center Support Programs:   > Cancer Support Group  2nd Tuesday of the month 1pm-2pm, Journey Room   

## 2018-03-28 LAB — HAPTOGLOBIN: Haptoglobin: 192 mg/dL (ref 32–363)

## 2018-03-30 ENCOUNTER — Other Ambulatory Visit (HOSPITAL_COMMUNITY): Payer: Self-pay | Admitting: Internal Medicine

## 2018-03-30 DIAGNOSIS — D509 Iron deficiency anemia, unspecified: Secondary | ICD-10-CM | POA: Insufficient documentation

## 2018-03-30 DIAGNOSIS — D508 Other iron deficiency anemias: Secondary | ICD-10-CM

## 2018-03-30 LAB — PROTEIN ELECTROPHORESIS, SERUM
A/G Ratio: 1.3 (ref 0.7–1.7)
Albumin ELP: 4.1 g/dL (ref 2.9–4.4)
Alpha-1-Globulin: 0.2 g/dL (ref 0.0–0.4)
Alpha-2-Globulin: 0.8 g/dL (ref 0.4–1.0)
Beta Globulin: 1 g/dL (ref 0.7–1.3)
GAMMA GLOBULIN: 1.1 g/dL (ref 0.4–1.8)
Globulin, Total: 3.1 g/dL (ref 2.2–3.9)
Total Protein ELP: 7.2 g/dL (ref 6.0–8.5)

## 2018-03-30 LAB — HEMOGLOBINOPATHY EVALUATION
HGB A2 QUANT: 2 % (ref 1.8–3.2)
HGB C: 0 %
Hgb A: 98 % (ref 96.4–98.8)
Hgb F Quant: 0 % (ref 0.0–2.0)
Hgb S Quant: 0 %
Hgb Variant: 0 %

## 2018-03-31 LAB — METHYLMALONIC ACID, SERUM: METHYLMALONIC ACID, QUANTITATIVE: 166 nmol/L (ref 0–378)

## 2018-04-03 ENCOUNTER — Ambulatory Visit (HOSPITAL_COMMUNITY): Payer: Medicare HMO

## 2018-04-06 ENCOUNTER — Encounter (HOSPITAL_COMMUNITY): Payer: Self-pay

## 2018-04-06 ENCOUNTER — Other Ambulatory Visit: Payer: Self-pay

## 2018-04-06 ENCOUNTER — Encounter: Payer: Self-pay | Admitting: Gastroenterology

## 2018-04-06 ENCOUNTER — Inpatient Hospital Stay (HOSPITAL_COMMUNITY): Payer: Medicare HMO | Attending: Hematology

## 2018-04-06 VITALS — BP 114/62 | HR 68 | Temp 98.4°F | Wt 137.6 lb

## 2018-04-06 DIAGNOSIS — D508 Other iron deficiency anemias: Secondary | ICD-10-CM

## 2018-04-06 DIAGNOSIS — D649 Anemia, unspecified: Secondary | ICD-10-CM | POA: Insufficient documentation

## 2018-04-06 MED ORDER — SODIUM CHLORIDE 0.9 % IV SOLN
Freq: Once | INTRAVENOUS | Status: AC
  Administered 2018-04-06: 13:00:00 via INTRAVENOUS

## 2018-04-06 MED ORDER — SODIUM CHLORIDE 0.9 % IV SOLN
510.0000 mg | Freq: Once | INTRAVENOUS | Status: AC
  Administered 2018-04-06: 510 mg via INTRAVENOUS
  Filled 2018-04-06: qty 17

## 2018-04-06 NOTE — Patient Instructions (Signed)
[facility] at [facility]  Discharge Instructions:   _______________________________________________________________  Thank you for choosing [facility] at [facility] to provide your oncology and hematology care.  To afford each patient quality time with our providers, please arrive at least 15 minutes before your scheduled appointment.  You need to re-schedule your appointment if you arrive 10 or more minutes late.  We strive to give you quality time with our providers, and arriving late affects you and other patients whose appointments are after yours.  Also, if you no show three or more times for appointments you may be dismissed from the clinic.  Again, thank you for choosing  Cancer Center at  Hospital. Our hope is that these requests will allow you access to exceptional care and in a timely manner. _______________________________________________________________  If you have questions after your visit, please contact our office at [phone] between the hours of 8:30 a.m. and 5:00 p.m. Voicemails left after 4:30 p.m. will not be returned until the following business day. _______________________________________________________________  For prescription refill requests, have your pharmacy contact our office. _______________________________________________________________  Recommendations made by the consultant and any test results will be sent to your referring physician. _______________________________________________________________ 

## 2018-04-06 NOTE — Progress Notes (Signed)
Pt presents today for Feraheme infusion.   Feraheme given today per MD orders. Tolerated infusion without adverse affects. Vital signs stable. No complaints at this time. Discharged from clinic ambulatory. F/U with [facility] as scheduled.

## 2018-04-13 ENCOUNTER — Inpatient Hospital Stay (HOSPITAL_COMMUNITY): Payer: Medicare HMO

## 2018-04-13 ENCOUNTER — Other Ambulatory Visit: Payer: Self-pay

## 2018-04-13 VITALS — BP 120/65 | HR 71 | Temp 97.9°F | Resp 18

## 2018-04-13 DIAGNOSIS — D508 Other iron deficiency anemias: Secondary | ICD-10-CM

## 2018-04-13 DIAGNOSIS — D649 Anemia, unspecified: Secondary | ICD-10-CM | POA: Diagnosis not present

## 2018-04-13 MED ORDER — SODIUM CHLORIDE 0.9 % IV SOLN
510.0000 mg | Freq: Once | INTRAVENOUS | Status: AC
  Administered 2018-04-13: 510 mg via INTRAVENOUS
  Filled 2018-04-13: qty 17

## 2018-04-13 MED ORDER — SODIUM CHLORIDE 0.9 % IV SOLN
Freq: Once | INTRAVENOUS | Status: AC
  Administered 2018-04-13: 08:00:00 via INTRAVENOUS

## 2018-04-13 NOTE — Progress Notes (Signed)
Adam Flynn tolerated Feraheme infusion without incident or complaint. VSS. Discharged self ambulatory in satisfactory condition in presence of family member.

## 2018-04-13 NOTE — Patient Instructions (Signed)
[facility] at [facility] _______________________________________________________________  Thank you for choosing [facility] at [facility] to provide your oncology and hematology care.  To afford each patient quality time with our providers, please arrive at least 15 minutes before your scheduled appointment.  You need to re-schedule your appointment if you arrive 10 or more minutes late.  We strive to give you quality time with our providers, and arriving late affects you and other patients whose appointments are after yours.  Also, if you no show three or more times for appointments you may be dismissed from the clinic.  Again, thank you for choosing  Cancer Center at [facility]. Our hope is that these requests will allow you access to exceptional care and in a timely manner. _______________________________________________________________  If you have questions after your visit, please contact our office at [phone] between the hours of 8:30 a.m. and 5:00 p.m. Voicemails left after 4:30 p.m. will not be returned until the following business day. _______________________________________________________________  For prescription refill requests, have your pharmacy contact our office. _______________________________________________________________  Recommendations made by the consultant and any test results will be sent to your referring physician. _______________________________________________________________ 

## 2018-04-21 ENCOUNTER — Ambulatory Visit (HOSPITAL_COMMUNITY): Payer: Medicare HMO | Admitting: Internal Medicine

## 2018-04-21 ENCOUNTER — Ambulatory Visit (HOSPITAL_COMMUNITY): Payer: Medicare HMO | Admitting: Hematology

## 2018-04-23 ENCOUNTER — Inpatient Hospital Stay (HOSPITAL_BASED_OUTPATIENT_CLINIC_OR_DEPARTMENT_OTHER): Payer: Medicare HMO | Admitting: Hematology

## 2018-04-23 ENCOUNTER — Ambulatory Visit (HOSPITAL_COMMUNITY): Payer: Medicare HMO | Admitting: Hematology

## 2018-04-23 ENCOUNTER — Other Ambulatory Visit: Payer: Self-pay

## 2018-04-23 DIAGNOSIS — D508 Other iron deficiency anemias: Secondary | ICD-10-CM

## 2018-04-23 NOTE — Patient Instructions (Signed)
[facility] at [facility] Discharge Instructions  You were seen today by Dr. Katragadda. He went over your recent results. He will see you back in for labs and follow up.   Thank you for choosing [facility] at [facility] to provide your oncology and hematology care.  To afford each patient quality time with our provider, please arrive at least 15 minutes before your scheduled appointment time.   If you have a lab appointment with the Cancer Center please come in thru the  Main Entrance and check in at the main information desk  You need to re-schedule your appointment should you arrive 10 or more minutes late.  We strive to give you quality time with our providers, and arriving late affects you and other patients whose appointments are after yours.  Also, if you no show three or more times for appointments you may be dismissed from the clinic at the providers discretion.     Again, thank you for choosing [facility].  Our hope is that these requests will decrease the amount of time that you wait before being seen by our physicians.       _____________________________________________________________  Should you have questions after your visit to  Cancer Center, please contact our office at [phone] between the hours of 8:00 a.m. and 4:30 p.m.  Voicemails left after 4:00 p.m. will not be returned until the following business day.  For prescription refill requests, have your pharmacy contact our office and allow 72 hours.    Cancer Center Support Programs:   > Cancer Support Group  2nd Tuesday of the month 1pm-2pm, Journey Room    

## 2018-04-23 NOTE — Progress Notes (Signed)
Virtual Visit via Telephone Note  I connected with JAYSHAWN COLSTON on 04/23/18 at 11:00 AM EDT by telephone and verified that I am speaking with the correct person using two identifiers.   I discussed the limitations, risks, security and privacy concerns of performing an evaluation and management service by telephone and the availability of in person appointments. I also discussed with the patient that there may be a patient responsible charge related to this service. The patient expressed understanding and agreed to proceed.   History of Present Illness: Mr. Maute was evaluated at our clinic by Dr. Walden Field for normocytic anemia.  He reports that he had a colonoscopy 2 years ago in [facility] which showed hemorrhoids but no polyps or malignancy.  Blood work was drawn at last visit.    Observations/Objective: Denies any bleeding per rectum or melena.  Denies any fevers, night sweats or weight loss.  Denies any recent hospitalizations.  He did receive Feraheme infusion on 04/06/2018 and 04/13/2018.  He started feeling much better after iron infusion.  His energy levels have picked up and he is eating better.  He did not have any major reactions to parenteral iron.   Assessment and Plan: 1.  Normocytic anemia: - This is from iron deficiency.  His ferritin was in the 20 range.  I have reviewed all the blood work with him.  SPEP was negative.  X32 and folic acid were within normal limits.  Hemoglobin electrophoresis was normal. -I think he does have some degree of renal insufficiency which is masked by hepatic insufficiency. - I have recommended that he follow-up with [place] in 6 weeks with repeat labs.   Follow Up Instructions: 6 weeks with labs one day prior   I discussed the assessment and treatment plan with the patient. The patient was provided an opportunity to ask questions and all were answered. The patient agreed with the plan and demonstrated an understanding of the  instructions.   The patient was advised to call back or seek an in-person evaluation if the symptoms worsen or if the condition fails to improve as anticipated.  I provided 10 minutes of non-face-to-face time during this encounter.   Derek Jack, MD

## 2018-06-09 ENCOUNTER — Ambulatory Visit (INDEPENDENT_AMBULATORY_CARE_PROVIDER_SITE_OTHER): Payer: Medicare HMO | Admitting: Gastroenterology

## 2018-06-09 ENCOUNTER — Telehealth: Payer: Self-pay

## 2018-06-09 ENCOUNTER — Other Ambulatory Visit: Payer: Self-pay

## 2018-06-09 ENCOUNTER — Encounter: Payer: Self-pay | Admitting: Gastroenterology

## 2018-06-09 DIAGNOSIS — K219 Gastro-esophageal reflux disease without esophagitis: Secondary | ICD-10-CM

## 2018-06-09 DIAGNOSIS — R1310 Dysphagia, unspecified: Secondary | ICD-10-CM

## 2018-06-09 DIAGNOSIS — R1319 Other dysphagia: Secondary | ICD-10-CM

## 2018-06-09 DIAGNOSIS — D509 Iron deficiency anemia, unspecified: Secondary | ICD-10-CM

## 2018-06-09 DIAGNOSIS — R933 Abnormal findings on diagnostic imaging of other parts of digestive tract: Secondary | ICD-10-CM

## 2018-06-09 MED ORDER — PANTOPRAZOLE SODIUM 40 MG PO TBEC: 40.0000 mg | DELAYED_RELEASE_TABLET | Freq: Every day | ORAL | 5 refills | Status: DC

## 2018-06-09 NOTE — Telephone Encounter (Signed)
PA for EGD/DIL submitted via [employer] website. Case approved. PA# [id number], 07/27/18-10/25/18.
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
## 2018-06-09 NOTE — Telephone Encounter (Signed)
Called pt, EGD/DIL w/Propofol w/RMR scheduled for 07/27/18 at 2:15pm. Orders entered.

## 2018-06-09 NOTE — Patient Instructions (Signed)
1. Upper endoscopy as scheduled.  Please see separate instructions. 2. I would like to start you on a medication for acid reflux, it will also help if you have any inflammation in your stomach or ulcers.  Prescription sent to your pharmacy. 3. Please follow-up with [facility] (Dr. Katragadda/Dr. Walden Field) regarding when you are due for follow-up and labs.

## 2018-06-09 NOTE — Progress Notes (Signed)
cc'ed to pcp °

## 2018-06-15 NOTE — Telephone Encounter (Signed)
Pre-op appt 07/23/18 at 1:45pm. Appt letter mailed with procedure instructions.

## 2018-06-17 ENCOUNTER — Telehealth: Payer: Self-pay | Admitting: Gastroenterology

## 2018-06-17 NOTE — Telephone Encounter (Signed)
Received records from person [facility] dated July 22, 2017.  Colonoscopy performed by Dr. Anne Hahn for Cologuard positive stool.  He was given general anesthesia.  Colon was normal.  According to the records his screening colonoscopy in 2012 was normal as well.  Await hematology labs. EGD as planned.

## 2018-07-15 ENCOUNTER — Other Ambulatory Visit (HOSPITAL_COMMUNITY): Payer: Medicare HMO

## 2018-07-16 ENCOUNTER — Ambulatory Visit (HOSPITAL_COMMUNITY): Payer: Medicare HMO | Admitting: Hematology

## 2018-07-21 ENCOUNTER — Telehealth: Payer: Self-pay

## 2018-07-21 DIAGNOSIS — K219 Gastro-esophageal reflux disease without esophagitis: Secondary | ICD-10-CM

## 2018-07-21 NOTE — Telephone Encounter (Signed)
Refill Request: [employer] refill request for Pantoprazole 40 mg received.

## 2018-07-23 ENCOUNTER — Telehealth: Payer: Self-pay

## 2018-07-23 ENCOUNTER — Other Ambulatory Visit: Payer: Self-pay

## 2018-07-23 ENCOUNTER — Other Ambulatory Visit (HOSPITAL_COMMUNITY)
Admission: RE | Admit: 2018-07-23 | Discharge: 2018-07-23 | Disposition: A | Discharge status: Home / Self Care | Payer: Medicare HMO | Source: Ambulatory Visit | Attending: Internal Medicine | Admitting: Internal Medicine

## 2018-07-23 ENCOUNTER — Encounter (HOSPITAL_COMMUNITY)
Admission: RE | Admit: 2018-07-23 | Discharge: 2018-07-23 | Disposition: A | Discharge status: Home / Self Care | Payer: Medicare HMO | Source: Ambulatory Visit | Attending: Internal Medicine | Admitting: Internal Medicine

## 2018-07-23 DIAGNOSIS — Z1159 Encounter for screening for other viral diseases: Secondary | ICD-10-CM | POA: Diagnosis present

## 2018-07-23 NOTE — Telephone Encounter (Signed)
Pt called office, procedure moved up to 12:45pm. He will arrive at 11:15am. Advised him to be NPO after 8:00am the morning of procedure. Verbalized understanding. LMOVM for endo scheduler.

## 2018-07-23 NOTE — Telephone Encounter (Addendum)
Tried to call pt to see if he can arrive earlier for EGD/DIL 07/27/18, no answer, LMOVM for return call.

## 2018-07-24 MED ORDER — PANTOPRAZOLE SODIUM 40 MG PO TBEC: 40.0000 mg | DELAYED_RELEASE_TABLET | Freq: Every day | ORAL | 3 refills | Status: DC

## 2018-07-24 NOTE — Telephone Encounter (Signed)
Rx sent to [facility] per request

## 2018-07-24 NOTE — Addendum Note (Signed)
Addended by: Gordy Levan, Jerad Dunlap A on: 07/24/2018 04:02 PM   Modules accepted: Orders

## 2018-07-25 LAB — NOVEL CORONAVIRUS, NAA (HOSP ORDER, SEND-OUT TO REF LAB; TAT 18-24 HRS): SARS-CoV-2, NAA: NOT DETECTED

## 2018-07-27 ENCOUNTER — Ambulatory Visit (HOSPITAL_COMMUNITY): Payer: Medicare HMO | Admitting: Anesthesiology

## 2018-07-27 ENCOUNTER — Ambulatory Visit (HOSPITAL_COMMUNITY)
Admission: RE | Admit: 2018-07-27 | Discharge: 2018-07-27 | Disposition: A | Discharge status: Home / Self Care | Payer: Medicare HMO | Attending: Internal Medicine | Admitting: Internal Medicine

## 2018-07-27 ENCOUNTER — Other Ambulatory Visit: Payer: Self-pay

## 2018-07-27 ENCOUNTER — Encounter (HOSPITAL_COMMUNITY): Payer: Self-pay | Admitting: Anesthesiology

## 2018-07-27 ENCOUNTER — Encounter (HOSPITAL_COMMUNITY)
Admission: RE | Disposition: A | Discharge status: Home / Self Care | Payer: Self-pay | Source: Home / Self Care | Attending: Internal Medicine

## 2018-07-27 DIAGNOSIS — Z886 Allergy status to analgesic agent status: Secondary | ICD-10-CM | POA: Diagnosis not present

## 2018-07-27 DIAGNOSIS — R000 Tachycardia, unspecified: Secondary | ICD-10-CM | POA: Diagnosis not present

## 2018-07-27 DIAGNOSIS — R1310 Dysphagia, unspecified: Secondary | ICD-10-CM

## 2018-07-27 DIAGNOSIS — Z8249 Family history of ischemic heart disease and other diseases of the circulatory system: Secondary | ICD-10-CM | POA: Insufficient documentation

## 2018-07-27 DIAGNOSIS — Z800 Family history of malignant neoplasm of digestive organs: Secondary | ICD-10-CM | POA: Diagnosis not present

## 2018-07-27 DIAGNOSIS — K219 Gastro-esophageal reflux disease without esophagitis: Secondary | ICD-10-CM

## 2018-07-27 DIAGNOSIS — Z87891 Personal history of nicotine dependence: Secondary | ICD-10-CM | POA: Insufficient documentation

## 2018-07-27 DIAGNOSIS — K210 Gastro-esophageal reflux disease with esophagitis: Secondary | ICD-10-CM | POA: Diagnosis not present

## 2018-07-27 DIAGNOSIS — Z7951 Long term (current) use of inhaled steroids: Secondary | ICD-10-CM | POA: Diagnosis not present

## 2018-07-27 DIAGNOSIS — K449 Diaphragmatic hernia without obstruction or gangrene: Secondary | ICD-10-CM | POA: Diagnosis not present

## 2018-07-27 DIAGNOSIS — E7800 Pure hypercholesterolemia, unspecified: Secondary | ICD-10-CM | POA: Insufficient documentation

## 2018-07-27 DIAGNOSIS — D509 Iron deficiency anemia, unspecified: Secondary | ICD-10-CM | POA: Insufficient documentation

## 2018-07-27 DIAGNOSIS — D449 Neoplasm of uncertain behavior of unspecified endocrine gland: Secondary | ICD-10-CM | POA: Diagnosis not present

## 2018-07-27 DIAGNOSIS — Z801 Family history of malignant neoplasm of trachea, bronchus and lung: Secondary | ICD-10-CM | POA: Insufficient documentation

## 2018-07-27 DIAGNOSIS — Z79899 Other long term (current) drug therapy: Secondary | ICD-10-CM | POA: Insufficient documentation

## 2018-07-27 DIAGNOSIS — R933 Abnormal findings on diagnostic imaging of other parts of digestive tract: Secondary | ICD-10-CM

## 2018-07-27 DIAGNOSIS — J449 Chronic obstructive pulmonary disease, unspecified: Secondary | ICD-10-CM | POA: Diagnosis not present

## 2018-07-27 DIAGNOSIS — N400 Enlarged prostate without lower urinary tract symptoms: Secondary | ICD-10-CM | POA: Diagnosis not present

## 2018-07-27 DIAGNOSIS — R1314 Dysphagia, pharyngoesophageal phase: Secondary | ICD-10-CM | POA: Insufficient documentation

## 2018-07-27 DIAGNOSIS — Z7982 Long term (current) use of aspirin: Secondary | ICD-10-CM | POA: Diagnosis not present

## 2018-07-27 DIAGNOSIS — R1319 Other dysphagia: Secondary | ICD-10-CM

## 2018-07-27 HISTORY — PX: MALONEY DILATION: SHX5535

## 2018-07-27 HISTORY — PX: ESOPHAGOGASTRODUODENOSCOPY (EGD) WITH PROPOFOL: SHX5813

## 2018-07-27 HISTORY — PX: BIOPSY: SHX5522

## 2018-07-27 SURGERY — ESOPHAGOGASTRODUODENOSCOPY (EGD) WITH PROPOFOL
Anesthesia: General

## 2018-07-27 MED ORDER — MIDAZOLAM HCL 2 MG/2ML IJ SOLN: 0.5000 mg | Freq: Once | INTRAMUSCULAR | Status: DC | PRN

## 2018-07-27 MED ORDER — HYDROMORPHONE HCL 1 MG/ML IJ SOLN: 0.2500 mg | INTRAMUSCULAR | Status: DC | PRN

## 2018-07-27 MED ORDER — CHLORHEXIDINE GLUCONATE CLOTH 2 % EX PADS: 6.0000 | MEDICATED_PAD | Freq: Once | CUTANEOUS | Status: DC

## 2018-07-27 MED ORDER — KETAMINE HCL 10 MG/ML IJ SOLN
INTRAMUSCULAR | Status: DC | PRN
  Administered 2018-07-27 (×2): 10 mg via INTRAVENOUS

## 2018-07-27 MED ORDER — HYDROCODONE-ACETAMINOPHEN 7.5-325 MG PO TABS: 1.0000 | ORAL_TABLET | Freq: Once | ORAL | Status: DC | PRN

## 2018-07-27 MED ORDER — PROPOFOL 10 MG/ML IV BOLUS
INTRAVENOUS | Status: DC | PRN
  Administered 2018-07-27: 30 mg via INTRAVENOUS
  Administered 2018-07-27 (×2): 20 mg via INTRAVENOUS

## 2018-07-27 MED ORDER — PROPOFOL 500 MG/50ML IV EMUL
INTRAVENOUS | Status: DC | PRN
  Administered 2018-07-27: 150 ug/kg/min via INTRAVENOUS

## 2018-07-27 MED ORDER — PROMETHAZINE HCL 25 MG/ML IJ SOLN: 6.2500 mg | INTRAMUSCULAR | Status: DC | PRN

## 2018-07-27 MED ORDER — LACTATED RINGERS IV SOLN
INTRAVENOUS | Status: DC
  Administered 2018-07-27: 12:00:00 via INTRAVENOUS

## 2018-07-27 MED ORDER — KETAMINE HCL 50 MG/5ML IJ SOSY
PREFILLED_SYRINGE | INTRAMUSCULAR | Status: AC
  Filled 2018-07-27: qty 5

## 2018-07-27 NOTE — Discharge Instructions (Signed)
EGD Discharge instructions Please read the instructions outlined below and refer to this sheet in the next few weeks. These discharge instructions provide you with general information on caring for yourself after you leave the hospital. Your doctor may also give you specific instructions. While your treatment has been planned according to the most current medical practices available, unavoidable complications occasionally occur. If you have any problems or questions after discharge, please call your doctor. ACTIVITY  You may resume your regular activity but move at a slower pace for the next 24 hours.   Take frequent rest periods for the next 24 hours.   Walking will help expel (get rid of) the air and reduce the bloated feeling in your abdomen.   No driving for 24 hours (because of the anesthesia (medicine) used during the test).   You may shower.   Do not sign any important legal documents or operate any machinery for 24 hours (because of the anesthesia used during the test).  NUTRITION  Drink plenty of fluids.   You may resume your normal diet.   Begin with a light meal and progress to your normal diet.   Avoid alcoholic beverages for 24 hours or as instructed by your caregiver.  MEDICATIONS  You may resume your normal medications unless your caregiver tells you otherwise.  WHAT YOU CAN EXPECT TODAY  You may experience abdominal discomfort such as a feeling of fullness or gas pains.  FOLLOW-UP  Your doctor will discuss the results of your test with you.  SEEK IMMEDIATE MEDICAL ATTENTION IF ANY OF THE FOLLOWING OCCUR:  Excessive nausea (feeling sick to your stomach) and/or vomiting.   Severe abdominal pain and distention (swelling).   Trouble swallowing.   Temperature over 101 F (37.8 C).    I dilated your esophagus today  Increase your Protonix to 40 mg twice daily  Further recommendations to follow pending review of pathology report  Office visit with [place] in  3 months  At patient request, I discussed with his sister, Alvira Monday, at [phone]  Rectal bleeding or vomiting of blood.     Monitored Anesthesia Care, Care After These instructions provide you with information about caring for yourself after your procedure. Your health care provider may also give you more specific instructions. Your treatment has been planned according to current medical practices, but problems sometimes occur. Call your health care provider if you have any problems or questions after your procedure. What can I expect after the procedure? After your procedure, you may:  Feel sleepy for several hours.  Feel clumsy and have poor balance for several hours.  Feel forgetful about what happened after the procedure.  Have poor judgment for several hours.  Feel nauseous or vomit.  Have a sore throat if you had a breathing tube during the procedure. Follow these instructions at home: For at least 24 hours after the procedure:      Have a responsible adult stay with you. It is important to have someone help care for you until you are awake and alert.  Rest as needed.  Do not: ? Participate in activities in which you could fall or become injured. ? Drive. ? Use heavy machinery. ? Drink alcohol. ? Take sleeping pills or medicines that cause drowsiness. ? Make important decisions or sign legal documents. ? Take care of children on your own. Eating and drinking  Follow the diet that is recommended by your health care provider.  If you vomit, drink water, juice, or  soup when you can drink without vomiting.  Make sure you have little or no nausea before eating solid foods. General instructions  Take over-the-counter and prescription medicines only as told by your health care provider.  If you have sleep apnea, surgery and certain medicines can increase your risk for breathing problems. Follow instructions from your health care provider about wearing  your sleep device: ? Anytime you are sleeping, including during daytime naps. ? While taking prescription pain medicines, sleeping medicines, or medicines that make you drowsy.  If you smoke, do not smoke without supervision.  Keep all follow-up visits as told by your health care provider. This is important. Contact a health care provider if:  You keep feeling nauseous or you keep vomiting.  You feel light-headed.  You develop a rash.  You have a fever. Get help right away if:  You have trouble breathing. Summary  For several hours after your procedure, you may feel sleepy and have poor judgment.  Have a responsible adult stay with you for at least 24 hours or until you are awake and alert. This information is not intended to replace advice given to you by your health care provider. Make sure you discuss any questions you have with your health care provider. Document Released: 05/07/2015 Document Revised: 04/14/2017 Document Reviewed: 05/07/2015 Elsevier Patient Education  2020 [employer].

## 2018-07-27 NOTE — Telephone Encounter (Signed)
Noted  
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
## 2018-07-27 NOTE — Transfer of Care (Signed)
Immediate Anesthesia Transfer of Care Note  Patient: GRAYSEN WOODYARD  Procedure(s) Performed: ESOPHAGOGASTRODUODENOSCOPY (EGD) WITH PROPOFOL (N/A ) MALONEY DILATION (N/A ) BIOPSY  Patient Location: PACU  Anesthesia Type:General  Level of Consciousness: awake  Airway & Oxygen Therapy: Patient Spontanous Breathing  Post-op Assessment: Report given to RN  Post vital signs: Reviewed and stable  Last Vitals:  Vitals Value Taken Time  BP 113/66 07/27/18 1415  Temp 37.1 C 07/27/18 1415  Pulse 57 07/27/18 1421  Resp 16 07/27/18 1421  SpO2 100 % 07/27/18 1421  Vitals shown include unvalidated device data.  Last Pain:  Vitals:   07/27/18 1415  TempSrc:   PainSc: 0-No pain      Patients Stated Pain Goal: 4 (41/36/43 [id number])  Complications: No apparent anesthesia complications

## 2018-07-27 NOTE — Anesthesia Preprocedure Evaluation (Signed)
Anesthesia Evaluation  Patient identified by MRN, date of birth, ID band Patient awake    Reviewed: Allergy & Precautions, NPO status , Patient's Chart, lab work & pertinent test results  Airway Mallampati: II  TM Distance: >3 FB Neck ROM: Full    Dental no notable dental hx. (+) Teeth Intact One gold cap on Front tooth -states its permanent:   Pulmonary COPD,  COPD inhaler, former smoker,    Pulmonary exam normal breath sounds clear to auscultation       Cardiovascular Exercise Tolerance: Good negative cardio ROS Normal cardiovascular examI Rhythm:Regular Rate:Normal     Neuro/Psych PSYCHIATRIC DISORDERS EtOH issues in pastnegative neurological ROS     GI/Hepatic GERD  Medicated and Controlled,(+) Hepatitis -  Endo/Other  negative endocrine ROS  Renal/GU Renal InsufficiencyRenal disease  negative genitourinary   Musculoskeletal negative musculoskeletal ROS (+)   Abdominal   Peds negative pediatric ROS (+)  Hematology negative hematology ROS (+) anemia ,   Anesthesia Other Findings   Reproductive/Obstetrics negative OB ROS                             Anesthesia Physical Anesthesia Plan  ASA: III  Anesthesia Plan: General   Post-op Pain Management:    Induction: Intravenous  PONV Risk Score and Plan: 2 and Propofol infusion, Ondansetron and Treatment may vary due to age or medical condition  Airway Management Planned: Nasal Cannula and Simple Face Mask  Additional Equipment:   Intra-op Plan:   Post-operative Plan:   Informed Consent: I have reviewed the patients History and Physical, chart, labs and discussed the procedure including the risks, benefits and alternatives for the proposed anesthesia with the patient or authorized representative who has indicated his/her understanding and acceptance.     Dental advisory given  Plan Discussed with: CRNA  Anesthesia Plan  Comments: (Plan Full PPE use  Plan GA with GETA as needed d/w pt -WTP with same after Q&A)        Anesthesia Quick Evaluation

## 2018-07-27 NOTE — Anesthesia Postprocedure Evaluation (Signed)
Anesthesia Post Note  Patient: Adam Flynn  Procedure(s) Performed: ESOPHAGOGASTRODUODENOSCOPY (EGD) WITH PROPOFOL (N/A ) MALONEY DILATION (N/A ) BIOPSY  Patient location during evaluation: PACU Anesthesia Type: General Level of consciousness: awake and alert and oriented Pain management: pain level controlled Vital Signs Assessment: post-procedure vital signs reviewed and stable Respiratory status: spontaneous breathing Cardiovascular status: blood pressure returned to baseline and stable Postop Assessment: no apparent nausea or vomiting Anesthetic complications: no     Last Vitals:  Vitals:   07/27/18 1412 07/27/18 1415  BP: 119/71 113/66  Pulse: 62 (!) 56  Resp: 19 18  Temp:  37.1 C  SpO2: 98% 99%    Last Pain:  Vitals:   07/27/18 1415  TempSrc:   PainSc: 0-No pain                 Habeeb Puertas

## 2018-07-27 NOTE — Op Note (Signed)
[facility] Patient Name: Adam Flynn Procedure Date: 07/27/2018 1:35 PM MRN: [id number] Date of Birth: 1950-08-13 Attending MD: Norvel Richards , MD CSN: [id number] Age: 68 Admit Type: Outpatient Procedure:                Upper GI endoscopy Indications:              Dysphagia Providers:                Norvel Richards, MD, Jeanann Lewandowsky. Sharon Seller, RN,                            Randa Spike, Technician Referring MD:              Medicines:                Propofol per Anesthesia Complications:            No immediate complications. Estimated Blood Loss:     Estimated blood loss was minimal. Estimated blood                            loss was minimal. Procedure:                Pre-Anesthesia Assessment:                           - Prior to the procedure, a History and Physical                            was performed, and patient medications and                            allergies were reviewed. The patient's tolerance of                            previous anesthesia was also reviewed. The risks                            and benefits of the procedure and the sedation                            options and risks were discussed with the patient.                            All questions were answered, and informed consent                            was obtained. Prior Anticoagulants: The patient has                            taken no previous anticoagulant or antiplatelet                            agents. ASA Grade Assessment: II - A patient with  mild systemic disease. After reviewing the risks                            and benefits, the patient was deemed in                            satisfactory condition to undergo the procedure.                           After obtaining informed consent, the endoscope was                            passed under direct vision. Throughout the                            procedure, the patient's blood  pressure, pulse, and                            oxygen saturations were monitored continuously. The                            [id number]-H190 ([id number]) scope was introduced through the                            mouth, and advanced to the second part of duodenum.                            The upper GI endoscopy was accomplished without                            difficulty. The patient tolerated the procedure                            well. Scope In: 1:56:09 PM Scope Out: 2:04:36 PM Total Procedure Duration: 0 hours 8 minutes 27 seconds  Findings:      The examined esophagus a couple of distal esophageal erosions. Minimal       furrowinging of the distal one third of the tubular esophagus. No ulcer       or infiltrate present infiltrating process seen; esophagus appeared       patent throughout its course.. .      A small hiatal hernia was present.      The duodenal bulb and second portion of the duodenum were normal. The       scope was withdrawn. Dilation was performed with a Maloney dilator with       mild resistance at 56 Fr. The dilation site was examined following       endoscope reinsertion and showed no change. Estimated blood loss: none .       Finally, This was biopsied with a cold forceps for histology. Estimated       blood loss was minimal Impression:               -Mild erosive reflux esophagitis. Mild mucosal  abnormality of doubtful clinical significance.                            Status post Maloney dilation and biopsy; small                            hiatal hernia. .                           - Normal duodenal bulb and second portion of the                            duodenum.                           - Moderate Sedation:      Moderate (conscious) sedation was personally administered by an       anesthesia professional. The following parameters were monitored: oxygen       saturation, heart rate, blood pressure, respiratory rate, EKG,  adequacy       of pulmonary ventilation, and response to care. Recommendation:           - Patient has a contact number available for                            emergencies. The signs and symptoms of potential                            delayed complications were discussed with the                            patient. Return to normal activities tomorrow.                            Written discharge instructions were provided to the                            patient.                           - Resume previous diet. Increase Protonix to 40 mg                            twice daily.                           - Continue present medications.                           - Await pathology results.                           - Return to GI office in 3 months. Procedure Code(s):        --- Professional ---                           [phone], Esophagogastroduodenoscopy, flexible,  transoral; with biopsy, single or multiple                           43450, Dilation of esophagus, by unguided sound or                            bougie, single or multiple passes Diagnosis Code(s):        --- Professional ---                           K44.9, Diaphragmatic hernia without obstruction or                            gangrene                           R13.10, Dysphagia, unspecified CPT copyright 2019 American Medical Association. All rights reserved. The codes documented in this report are preliminary and upon coder review may  be revised to meet current compliance requirements. Cristopher Estimable. Colvin Blatt, MD Norvel Richards, MD 07/27/2018 2:18:20 PM This report has been signed electronically. Number of Addenda: 0

## 2018-07-29 ENCOUNTER — Other Ambulatory Visit (HOSPITAL_COMMUNITY): Payer: Medicare HMO

## 2018-07-30 ENCOUNTER — Encounter (HOSPITAL_COMMUNITY): Payer: Self-pay | Admitting: Internal Medicine

## 2018-07-30 ENCOUNTER — Encounter: Payer: Self-pay | Admitting: Internal Medicine

## 2018-08-04 ENCOUNTER — Ambulatory Visit (HOSPITAL_COMMUNITY): Payer: Medicare HMO | Admitting: Hematology

## 2018-08-12 ENCOUNTER — Other Ambulatory Visit: Payer: Self-pay

## 2018-08-12 ENCOUNTER — Inpatient Hospital Stay (HOSPITAL_COMMUNITY): Payer: Medicare HMO | Attending: Hematology

## 2018-08-12 DIAGNOSIS — D508 Other iron deficiency anemias: Secondary | ICD-10-CM

## 2018-08-12 DIAGNOSIS — D649 Anemia, unspecified: Secondary | ICD-10-CM | POA: Diagnosis not present

## 2018-08-12 LAB — RETICULOCYTES
Immature Retic Fract: 12.1 % (ref 2.3–15.9)
RBC.: 3.48 MIL/uL — ABNORMAL LOW (ref 4.22–5.81)
Retic Count, Absolute: 43.5 10*3/uL (ref 19.0–186.0)
Retic Ct Pct: 1.3 % (ref 0.4–3.1)

## 2018-08-12 LAB — CBC WITH DIFFERENTIAL/PLATELET
Abs Immature Granulocytes: 0.01 10*3/uL (ref 0.00–0.07)
Basophils Absolute: 0 10*3/uL (ref 0.0–0.1)
Basophils Relative: 1 %
Eosinophils Absolute: 0.1 10*3/uL (ref 0.0–0.5)
Eosinophils Relative: 2 %
HCT: 34.6 % — ABNORMAL LOW (ref 39.0–52.0)
Hemoglobin: 11.2 g/dL — ABNORMAL LOW (ref 13.0–17.0)
Immature Granulocytes: 0 %
Lymphocytes Relative: 32 %
Lymphs Abs: 1.9 10*3/uL (ref 0.7–4.0)
MCH: 32.2 pg (ref 26.0–34.0)
MCHC: 32.4 g/dL (ref 30.0–36.0)
MCV: 99.4 fL (ref 80.0–100.0)
Monocytes Absolute: 0.5 10*3/uL (ref 0.1–1.0)
Monocytes Relative: 9 %
Neutro Abs: 3.3 10*3/uL (ref 1.7–7.7)
Neutrophils Relative %: 56 %
Platelets: 250 10*3/uL (ref 150–400)
RBC: 3.48 MIL/uL — ABNORMAL LOW (ref 4.22–5.81)
RDW: 12.9 % (ref 11.5–15.5)
WBC: 5.8 10*3/uL (ref 4.0–10.5)
nRBC: 0 % (ref 0.0–0.2)

## 2018-08-12 LAB — COMPREHENSIVE METABOLIC PANEL
ALT: 27 U/L (ref 0–44)
AST: 38 U/L (ref 15–41)
Albumin: 4.3 g/dL (ref 3.5–5.0)
Alkaline Phosphatase: 59 U/L (ref 38–126)
Anion gap: 9 (ref 5–15)
BUN: 15 mg/dL (ref 8–23)
CO2: 28 mmol/L (ref 22–32)
Calcium: 9.6 mg/dL (ref 8.9–10.3)
Chloride: 103 mmol/L (ref 98–111)
Creatinine, Ser: 1.16 mg/dL (ref 0.61–1.24)
GFR calc Af Amer: >60 mL/min (ref >60)
GFR calc non Af Amer: >60 mL/min (ref >60)
Glucose, Bld: 130 mg/dL — ABNORMAL HIGH (ref 70–99)
Potassium: 3.9 mmol/L (ref 3.5–5.1)
Sodium: 140 mmol/L (ref 135–145)
Total Bilirubin: 0.8 mg/dL (ref 0.3–1.2)
Total Protein: 7.6 g/dL (ref 6.5–8.1)

## 2018-08-12 LAB — IRON AND TIBC
Iron: 67 ug/dL (ref 45–182)
Saturation Ratios: 24 % (ref 17.9–39.5)
TIBC: 281 ug/dL (ref 250–450)
UIBC: 214 ug/dL

## 2018-08-12 LAB — LACTATE DEHYDROGENASE: LDH: 138 U/L (ref 98–192)

## 2018-08-12 LAB — FERRITIN: Ferritin: 372 ng/mL — ABNORMAL HIGH (ref 24–336)

## 2018-08-19 ENCOUNTER — Ambulatory Visit (HOSPITAL_COMMUNITY): Payer: Medicare HMO | Admitting: Hematology

## 2018-08-27 ENCOUNTER — Ambulatory Visit (HOSPITAL_COMMUNITY): Payer: Medicare HMO | Admitting: Hematology

## 2018-10-27 ENCOUNTER — Encounter: Payer: Self-pay | Admitting: Internal Medicine

## 2018-10-27 ENCOUNTER — Ambulatory Visit (INDEPENDENT_AMBULATORY_CARE_PROVIDER_SITE_OTHER): Payer: Medicare HMO | Admitting: Internal Medicine

## 2018-10-27 ENCOUNTER — Other Ambulatory Visit: Payer: Self-pay

## 2018-10-27 DIAGNOSIS — K219 Gastro-esophageal reflux disease without esophagitis: Secondary | ICD-10-CM

## 2018-10-27 DIAGNOSIS — R1319 Other dysphagia: Secondary | ICD-10-CM

## 2018-10-27 DIAGNOSIS — R1310 Dysphagia, unspecified: Secondary | ICD-10-CM

## 2018-10-27 DIAGNOSIS — D509 Iron deficiency anemia, unspecified: Secondary | ICD-10-CM | POA: Diagnosis not present
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
## 2019-03-26 ENCOUNTER — Ambulatory Visit (INDEPENDENT_AMBULATORY_CARE_PROVIDER_SITE_OTHER): Payer: Medicare HMO | Admitting: Cardiology

## 2019-03-26 ENCOUNTER — Encounter: Payer: Self-pay | Admitting: Cardiology

## 2019-03-26 ENCOUNTER — Other Ambulatory Visit: Payer: Self-pay

## 2019-03-26 ENCOUNTER — Ambulatory Visit: Payer: Medicare HMO | Admitting: Cardiology

## 2019-03-26 VITALS — BP 138/80 | HR 61 | Ht 67.0 in | Wt 141.8 lb

## 2019-03-26 DIAGNOSIS — E7800 Pure hypercholesterolemia, unspecified: Secondary | ICD-10-CM

## 2019-03-26 DIAGNOSIS — R600 Localized edema: Secondary | ICD-10-CM | POA: Diagnosis not present

## 2019-03-26 DIAGNOSIS — R011 Cardiac murmur, unspecified: Secondary | ICD-10-CM | POA: Diagnosis not present
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
## 2019-03-26 NOTE — Patient Instructions (Signed)
Medication Instructions:  Your physician recommends that you continue on your current medications as directed. Please refer to the Current Medication list given to you today.  *If you need a refill on your cardiac medications before your next appointment, please call your pharmacy*  Lab Work: none If you have labs (blood work) drawn today and your tests are completely normal, you will receive your results only by: Marland Kitchen MyChart Message (if you have MyChart) OR . A paper copy in the mail If you have any lab test that is abnormal or we need to change your treatment, we will call you to review the results.  Testing/Procedures: Your physician has requested that you have an echocardiogram. Echocardiography is a painless test that uses sound waves to create images of your heart. It provides your doctor with information about the size and shape of your heart and how well your heart's chambers and valves are working. This procedure takes approximately one hour. There are no restrictions for this procedure. You may get an IV, if needed, to receive an ultrasound enhancing agent through to better visualize your heart.   Follow-Up: At [facility], you and your health needs are our priority.  As part of our continuing mission to provide you with exceptional heart care, we have created designated Provider Care Teams.  These Care Teams include your primary Cardiologist (physician) and Advanced Practice Providers (APPs -  Physician Assistants and Nurse Practitioners) who all work together to provide you with the care you need, when you need it.  We recommend signing up for the patient portal called "MyChart".  Sign up information is provided on this After Visit Summary.  MyChart is used to connect with patients for Virtual Visits (Telemedicine).  Patients are able to view lab/test results, encounter notes, upcoming appointments, etc.  Non-urgent messages can be sent to your provider as well.   To learn more about  what you can do with MyChart, go to [email or web address].    Your next appointment:   After testing.  The format for your next appointment:   In Person  Provider:   Kate Sable, MD  Echocardiogram An echocardiogram is a procedure that uses painless sound waves (ultrasound) to produce an image of the heart. Images from an echocardiogram can provide important information about:  Signs of coronary artery disease (CAD).  Aneurysm detection. An aneurysm is a weak or damaged part of an artery wall that bulges out from the normal force of blood pumping through the body.  Heart size and shape. Changes in the size or shape of the heart can be associated with certain conditions, including heart failure, aneurysm, and CAD.  Heart muscle function.  Heart valve function.  Signs of a past heart attack.  Fluid buildup around the heart.  Thickening of the heart muscle.  A tumor or infectious growth around the heart valves. Tell a health care provider about:  Any allergies you have.  All medicines you are taking, including vitamins, herbs, eye drops, creams, and over-the-counter medicines.  Any blood disorders you have.  Any surgeries you have had.  Any medical conditions you have.  Whether you are pregnant or may be pregnant. What are the risks? Generally, this is a safe procedure. However, problems may occur, including:  Allergic reaction to dye (contrast) that may be used during the procedure. What happens before the procedure? No specific preparation is needed. You may eat and drink normally. What happens during the procedure?   An IV tube may be  inserted into one of your veins.  You may receive contrast through this tube. A contrast is an injection that improves the quality of the pictures from your heart.  A gel will be applied to your chest.  A wand-like tool (transducer) will be moved over your chest. The gel will help to transmit the sound waves from the  transducer.  The sound waves will harmlessly bounce off of your heart to allow the heart images to be captured in real-time motion. The images will be recorded on a computer. The procedure may vary among health care providers and hospitals. What happens after the procedure?  You may return to your normal, everyday life, including diet, activities, and medicines, unless your health care provider tells you not to do that. Summary  An echocardiogram is a procedure that uses painless sound waves (ultrasound) to produce an image of the heart.  Images from an echocardiogram can provide important information about the size and shape of your heart, heart muscle function, heart valve function, and fluid buildup around your heart.  You do not need to do anything to prepare before this procedure. You may eat and drink normally.  After the echocardiogram is completed, you may return to your normal, everyday life, unless your health care provider tells you not to do that. This information is not intended to replace advice given to you by your health care provider. Make sure you discuss any questions you have with your health care provider. Document Revised: 05/07/2018 Document Reviewed: 02/17/2016 Elsevier Patient Education  [place].

## 2019-04-02 ENCOUNTER — Ambulatory Visit: Payer: Medicare HMO | Admitting: Cardiology

## 2019-05-06 ENCOUNTER — Other Ambulatory Visit: Payer: Medicare HMO

## 2019-05-14 ENCOUNTER — Ambulatory Visit: Payer: Medicare HMO | Admitting: Cardiology

## 2019-06-08 ENCOUNTER — Telehealth: Payer: Self-pay | Admitting: Cardiology

## 2019-06-08 NOTE — Telephone Encounter (Signed)
Attempted to schedule.  LMOV to call office.  ° °

## 2019-06-08 NOTE — Telephone Encounter (Signed)
-----   Message from Anselm Pancoast, [place] sent at 06/07/2019 11:02 AM EDT ----- This patient is scheduled for a follow up with Kate Sable for May 17th but has not came for his Echo. Can we schedule him for the Echo and then the follow up? Thanks, Ivin Booty

## 2019-06-14 ENCOUNTER — Ambulatory Visit: Payer: Medicare HMO | Admitting: Cardiology

## 2019-08-19 ENCOUNTER — Other Ambulatory Visit: Payer: Medicare HMO

## 2019-08-25 ENCOUNTER — Telehealth: Payer: Self-pay | Admitting: Cardiology

## 2019-08-25 NOTE — Telephone Encounter (Signed)
Attempted to schedule.  LMOV to call office.  ° °

## 2019-08-25 NOTE — Telephone Encounter (Signed)
-----   Message from Marykay Lex sent at 08/20/2019  4:16 PM EDT ----- Regarding: FW: NEEDS ECHO LVM for patient to callback and reschedule echo ----- Message ----- From: Janan Ridge, CMA Sent: 08/20/2019   3:16 PM EDT To: [facility]y [facility] Scheduling Subject: NEEDS ECHO                                     Patient is scheduled to see Dr. Garen Lah 08/26/2019.  Echo was scheduled. Looks like he didn't show up or cancelled it.  Can we try to get him another ECHO appt?  Thank you!

## 2019-08-26 ENCOUNTER — Ambulatory Visit: Payer: Medicare HMO | Admitting: Cardiology

## 2019-08-27 ENCOUNTER — Encounter: Payer: Self-pay | Admitting: Cardiology

## 2019-10-18 NOTE — Telephone Encounter (Signed)
Attempted to schedule no ans no vm  

## 2019-12-29 NOTE — Telephone Encounter (Signed)
Called patient but phone number was not valid

## 2020-08-21 ENCOUNTER — Encounter: Payer: Self-pay | Admitting: Internal Medicine

## 2020-12-06 ENCOUNTER — Encounter (HOSPITAL_COMMUNITY): Payer: Self-pay | Admitting: Internal Medicine

## 2020-12-20 ENCOUNTER — Encounter: Payer: Self-pay | Admitting: Internal Medicine

## 2020-12-20 ENCOUNTER — Ambulatory Visit: Payer: Medicare HMO | Admitting: Gastroenterology

## 2021-06-22 ENCOUNTER — Inpatient Hospital Stay (HOSPITAL_COMMUNITY): Payer: Medicare Other | Attending: Hematology | Admitting: Hematology

## 2021-06-22 DIAGNOSIS — N189 Chronic kidney disease, unspecified: Secondary | ICD-10-CM | POA: Insufficient documentation

## 2021-06-27 ENCOUNTER — Other Ambulatory Visit: Payer: Self-pay | Admitting: Nephrology

## 2021-06-27 ENCOUNTER — Other Ambulatory Visit (HOSPITAL_COMMUNITY): Payer: Self-pay | Admitting: Nephrology

## 2021-06-27 DIAGNOSIS — N1832 Chronic kidney disease, stage 3b: Secondary | ICD-10-CM

## 2021-06-27 DIAGNOSIS — N170 Acute kidney failure with tubular necrosis: Secondary | ICD-10-CM

## 2021-06-27 DIAGNOSIS — D638 Anemia in other chronic diseases classified elsewhere: Secondary | ICD-10-CM

## 2021-07-06 ENCOUNTER — Ambulatory Visit (HOSPITAL_COMMUNITY)
Admission: RE | Admit: 2021-07-06 | Discharge: 2021-07-06 | Disposition: A | Discharge status: Home / Self Care | Payer: Medicare Other | Source: Ambulatory Visit | Attending: Nephrology | Admitting: Nephrology

## 2021-07-06 DIAGNOSIS — D638 Anemia in other chronic diseases classified elsewhere: Secondary | ICD-10-CM | POA: Insufficient documentation

## 2021-07-06 DIAGNOSIS — N1832 Chronic kidney disease, stage 3b: Secondary | ICD-10-CM | POA: Diagnosis present

## 2021-07-06 DIAGNOSIS — N170 Acute kidney failure with tubular necrosis: Secondary | ICD-10-CM | POA: Diagnosis present

## 2022-01-08 ENCOUNTER — Other Ambulatory Visit: Payer: Self-pay

## 2022-01-08 ENCOUNTER — Emergency Department (HOSPITAL_COMMUNITY)
Admission: EM | Admit: 2022-01-08 | Discharge: 2022-01-08 | Disposition: A | Discharge status: Home / Self Care | Payer: Medicare Other | Attending: Emergency Medicine | Admitting: Emergency Medicine

## 2022-01-08 ENCOUNTER — Encounter (HOSPITAL_COMMUNITY): Payer: Self-pay

## 2022-01-08 DIAGNOSIS — E860 Dehydration: Secondary | ICD-10-CM | POA: Insufficient documentation

## 2022-01-08 DIAGNOSIS — R102 Pelvic and perineal pain: Secondary | ICD-10-CM | POA: Diagnosis not present

## 2022-01-08 DIAGNOSIS — R109 Unspecified abdominal pain: Secondary | ICD-10-CM | POA: Diagnosis present

## 2022-01-08 DIAGNOSIS — R39198 Other difficulties with micturition: Secondary | ICD-10-CM | POA: Insufficient documentation

## 2022-01-08 LAB — CBC
HCT: 33.6 % — ABNORMAL LOW (ref 39.0–52.0)
Hemoglobin: 11 g/dL — ABNORMAL LOW (ref 13.0–17.0)
MCH: 32.4 pg (ref 26.0–34.0)
MCHC: 32.7 g/dL (ref 30.0–36.0)
MCV: 98.8 fL (ref 80.0–100.0)
Platelets: 226 10*3/uL (ref 150–400)
RBC: 3.4 MIL/uL — ABNORMAL LOW (ref 4.22–5.81)
RDW: 14.9 % (ref 11.5–15.5)
WBC: 4.4 10*3/uL (ref 4.0–10.5)
nRBC: 0 % (ref 0.0–0.2)

## 2022-01-08 LAB — URINALYSIS, ROUTINE W REFLEX MICROSCOPIC
Bacteria, UA: NONE SEEN
Bilirubin Urine: NEGATIVE
Glucose, UA: NEGATIVE mg/dL
Hgb urine dipstick: NEGATIVE
Ketones, ur: NEGATIVE mg/dL
Leukocytes,Ua: NEGATIVE
Nitrite: NEGATIVE
Protein, ur: 30 mg/dL — AB
Specific Gravity, Urine: 1.017 (ref 1.005–1.030)
pH: 6 (ref 5.0–8.0)

## 2022-01-08 LAB — COMPREHENSIVE METABOLIC PANEL
ALT: 14 U/L (ref 0–44)
AST: 23 U/L (ref 15–41)
Albumin: 3.9 g/dL (ref 3.5–5.0)
Alkaline Phosphatase: 49 U/L (ref 38–126)
Anion gap: 10 (ref 5–15)
BUN: 20 mg/dL (ref 8–23)
CO2: 26 mmol/L (ref 22–32)
Calcium: 9.8 mg/dL (ref 8.9–10.3)
Chloride: 103 mmol/L (ref 98–111)
Creatinine, Ser: 1.3 mg/dL — ABNORMAL HIGH (ref 0.61–1.24)
GFR, Estimated: 59 mL/min — ABNORMAL LOW (ref >60)
Glucose, Bld: 102 mg/dL — ABNORMAL HIGH (ref 70–99)
Potassium: 4 mmol/L (ref 3.5–5.1)
Sodium: 139 mmol/L (ref 135–145)
Total Bilirubin: 0.3 mg/dL (ref 0.3–1.2)
Total Protein: 7.7 g/dL (ref 6.5–8.1)

## 2022-01-08 LAB — LIPASE, BLOOD: Lipase: 60 U/L — ABNORMAL HIGH (ref 11–51)

## 2022-01-08 MED ORDER — SODIUM CHLORIDE 0.9 % IV BOLUS
1000.0000 mL | Freq: Once | INTRAVENOUS | Status: AC
  Administered 2022-01-08: 1000 mL via INTRAVENOUS

## 2022-01-08 NOTE — Discharge Instructions (Addendum)
As discussed, your evaluation today has been largely reassuring.  But, it is important that you monitor your condition carefully, and do not hesitate to return to the ED if you develop new, or concerning changes in your condition. ? ?Otherwise, please follow-up with your physician for appropriate ongoing care. ? ?

## 2022-01-08 NOTE — ED Triage Notes (Signed)
Pt presents to ED with complaints of lower abdominal pain, states PCP found blood in urine. States pain has been "on and off for a while now." States he urinates a lot but pain is happening more frequently

## 2022-01-08 NOTE — ED Provider Notes (Signed)
[facility] EMERGENCY DEPARTMENT Provider Note   CSN: [id number] Arrival date & time: 01/08/22  0801     History  Chief Complaint  Patient presents with   Abdominal Pain    Adam Flynn is a 71 y.o. male.  HPI Patient presents with concern of suprapubic pain, difficulty with urination.  He notes that he has previously been informed of prostate issues, has had difficulty with hematuria.  He presents with ongoing concerns, no focal, acute changes today, but ongoing discomfort in his suprapubic region, as well as difficulty with initiating urinary stream.  No fevers, chills, weight loss, chest pain, dyspnea. He has not seen a urologist.    Home Medications Prior to Admission medications   Medication Sig Start Date End Date Taking? Authorizing Provider  atenolol (TENORMIN) 25 MG tablet Take 12.5 mg by mouth daily.   Yes [provider]  atorvastatin (LIPITOR) 40 MG tablet Take 40 mg by mouth daily. 11/06/16  Yes [provider]  cetirizine (ZYRTEC) 10 MG tablet Take 10 mg by mouth daily. 02/15/15  Yes [provider]  DIALYVITE VITAMIN D3 MAX 1.25 MG (50000 UT) TABS Take 50,000 Units by mouth once a week.  03/24/18  Yes [provider]  magnesium oxide (MAG-OX) 400 (241.3 Mg) MG tablet Take by mouth daily.  11/20/17  Yes [provider]  Multiple Vitamin (ONE-A-DAY MENS PO) Take 1 tablet by mouth daily.   Yes [provider]  tamsulosin (FLOMAX) 0.4 MG CAPS capsule Take 0.4 mg by mouth daily.   Yes [provider]  VENTOLIN HFA 108 (90 Base) MCG/ACT inhaler Inhale 2 puffs into the lungs every 6 (six) hours as needed for wheezing or shortness of breath.  12/05/17  Yes [provider]      Allergies    Asa [aspirin]    Review of Systems   Review of Systems  All other systems reviewed and are negative.   Physical Exam Updated Vital Signs BP (!) 146/69   Pulse (!) 58   Temp 98.7 F (37.1 C) (Oral)    Resp 16   Ht '5\' 6"'$  (1.676 m)   Wt 61.2 kg   SpO2 100%   BMI 21.79 kg/m  Physical Exam Vitals and nursing note reviewed.  Constitutional:      General: He is not in acute distress.    Appearance: He is well-developed.  HENT:     Head: Normocephalic and atraumatic.  Eyes:     Conjunctiva/sclera: Conjunctivae normal.  Cardiovascular:     Rate and Rhythm: Normal rate and regular rhythm.  Pulmonary:     Effort: Pulmonary effort is normal. No respiratory distress.     Breath sounds: No stridor.  Abdominal:     General: There is no distension.     Tenderness: There is abdominal tenderness in the suprapubic area.  Skin:    General: Skin is warm and dry.  Neurological:     Mental Status: He is alert and oriented to person, place, and time.     ED Results / Procedures / Treatments   Labs (all labs ordered are listed, but only abnormal results are displayed) Labs Reviewed  LIPASE, BLOOD - Abnormal; Notable for the following components:      Result Value   Lipase 60 (*)    All other components within normal limits  COMPREHENSIVE METABOLIC PANEL - Abnormal; Notable for the following components:   Glucose, Bld 102 (*)    Creatinine, Ser 1.30 (*)  GFR, Estimated 59 (*)    All other components within normal limits  CBC - Abnormal; Notable for the following components:   RBC 3.40 (*)    Hemoglobin 11.0 (*)    HCT 33.6 (*)    All other components within normal limits  URINALYSIS, ROUTINE W REFLEX MICROSCOPIC - Abnormal; Notable for the following components:   Protein, ur 30 (*)    All other components within normal limits    EKG None  Radiology No results found.  Procedures Procedures    Medications Ordered in ED Medications  sodium chloride 0.9 % bolus 1,000 mL (0 mLs Intravenous Stopped 01/08/22 1321)    ED Course/ Medical Decision Making/ A&P  This patient with a Hx of COPD, AKI, prior alcohol use presents to the ED for concern of difficulty initiating urinary  stream, suprapubic pain, this involves an extensive number of treatment options, and is a complaint that carries with it a high risk of complications and morbidity.    The differential diagnosis includes prostatitis, transient retention, urinary tract infection, dehydration   Social Determinants of Health:  Prior alcohol abuse  Additional history obtained:  Additional history and/or information obtained from chart review, notable for history of alcohol use, AKI   After the initial evaluation, orders, including: Labs urinalysis were initiated.   The patient was also maintained on pulse oximetry. The readings were typically 100% room air normal   On repeat evaluation of the patient improved Patient wake, alert, ambulatory.  [place]ast[place] findings, the patient's history of alcohol use, importance of following up with primary care and urology.    Lab Tests:  I personally interpreted labs.  The pertinent results include: Slight increase in creatinine, page 60 otherwise generally unremarkable  I Dispostion / Final MDM:  After consideration of the diagnostic results and the patient's response to treatment, this adult male presents with intermittent ongoing dysuria or difficulty initiating stream, with history of prostatitis, as well as alcohol use, differential as above with prostatitis, urinary tract infection, bacteremia, sepsis, obstruction all considered.  Patient improved here received fluid resuscitation, had no evidence for any of the above, some suspicion for dehydration contributing to his symptoms.  Patient encouraged to drink alcohol only in moderation, will follow-up with primary care and urology.    Final Clinical Impression(s) / ED Diagnoses Final diagnoses:  Suprapubic pain  Dehydration     Carmin Muskrat, MD 01/08/22 1327

## 2022-05-02 ENCOUNTER — Encounter (HOSPITAL_COMMUNITY): Payer: Self-pay | Admitting: Internal Medicine

## 2022-06-26 ENCOUNTER — Ambulatory Visit: Payer: 59 | Admitting: Urology

## 2022-06-26 DIAGNOSIS — N400 Enlarged prostate without lower urinary tract symptoms: Secondary | ICD-10-CM

## 2022-06-29 IMAGING — US US RENAL
1 series · 14 of 25 positions shown · non-contrast
Comparison: 04/24/2015.

CLINICAL DATA: Acute renal failure, chronic kidney disease stage
IIIB.

EXAM:
RENAL / URINARY TRACT ULTRASOUND COMPLETE

[Series 1: us renal · 14 of 59 slices shown]
[im 1/59]
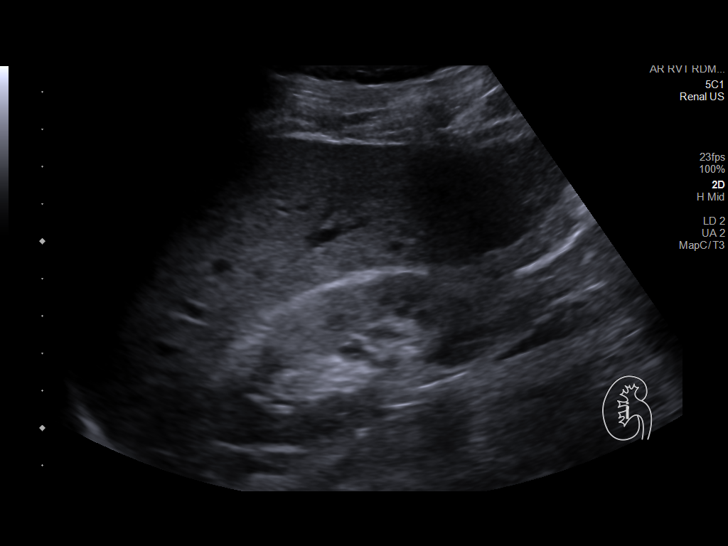
[im 5/59]
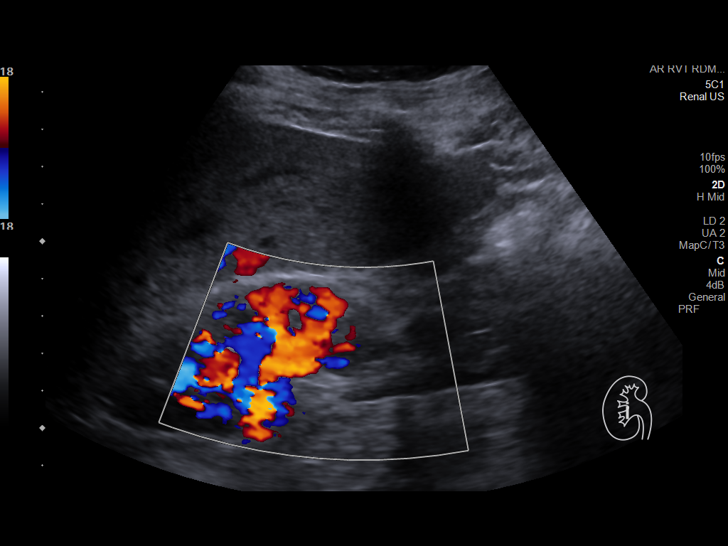
[im 10/59]
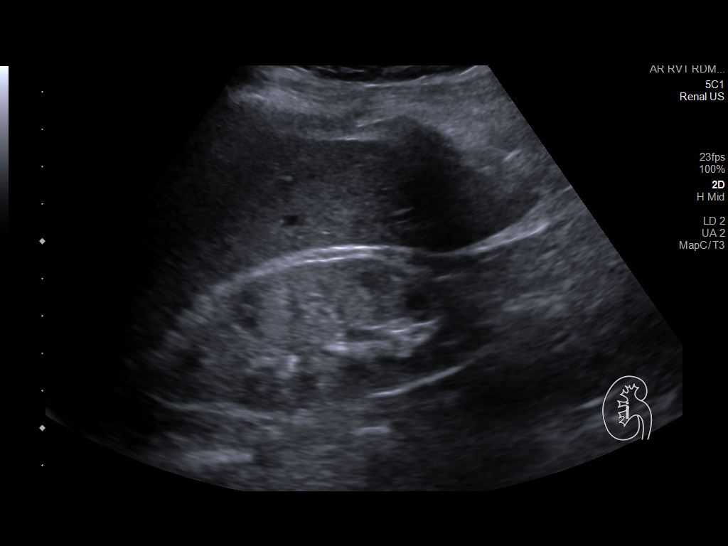
[im 15/59]
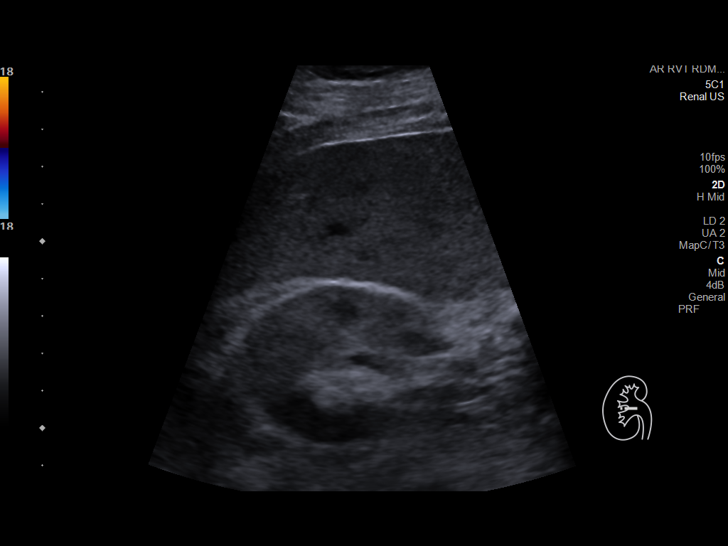
[im 20/59]
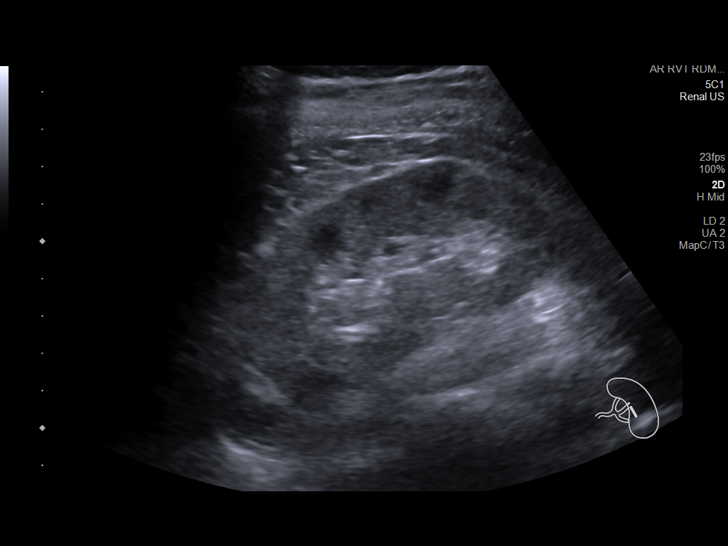
[im 22/59]
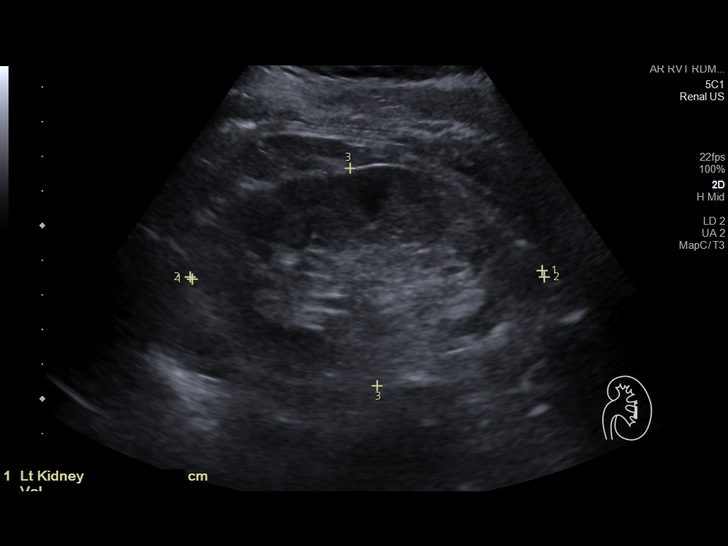
[im 27/59]
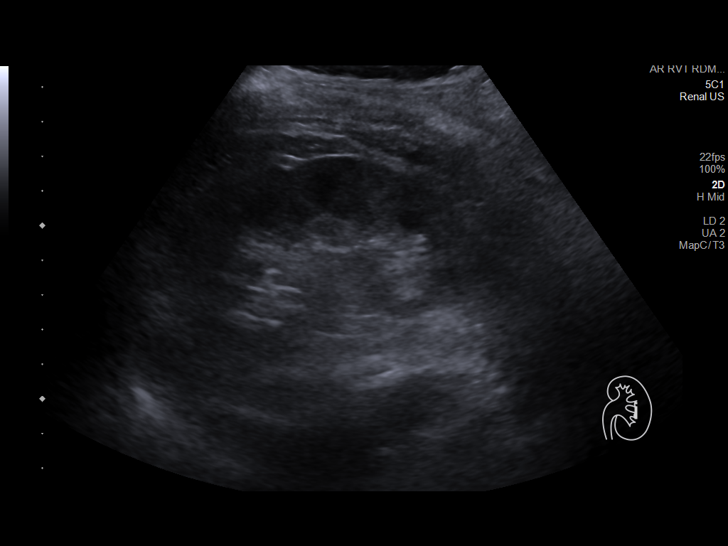
[im 32/59]
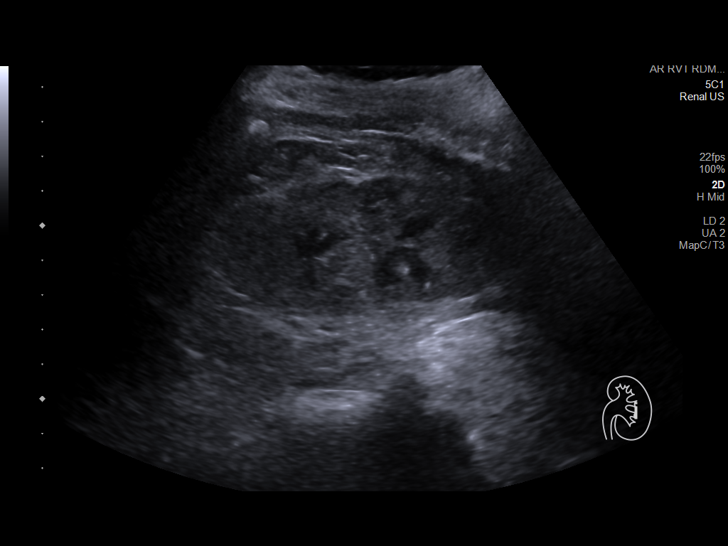
[im 37/59]
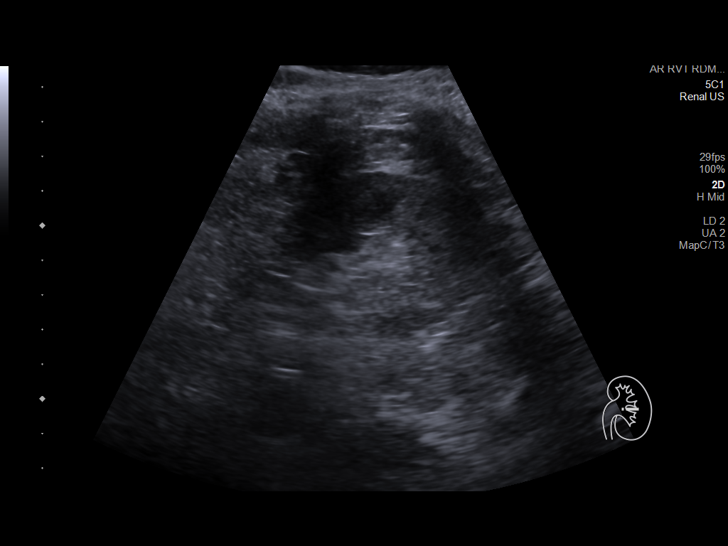
[im 39/59]
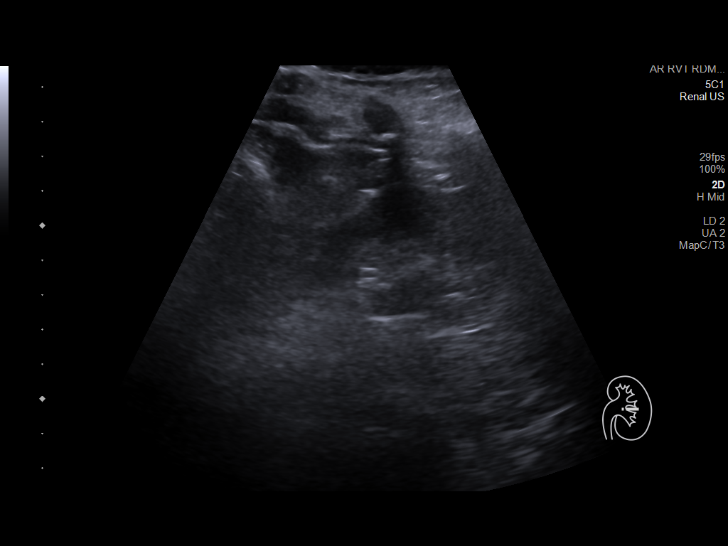
[im 44/59]
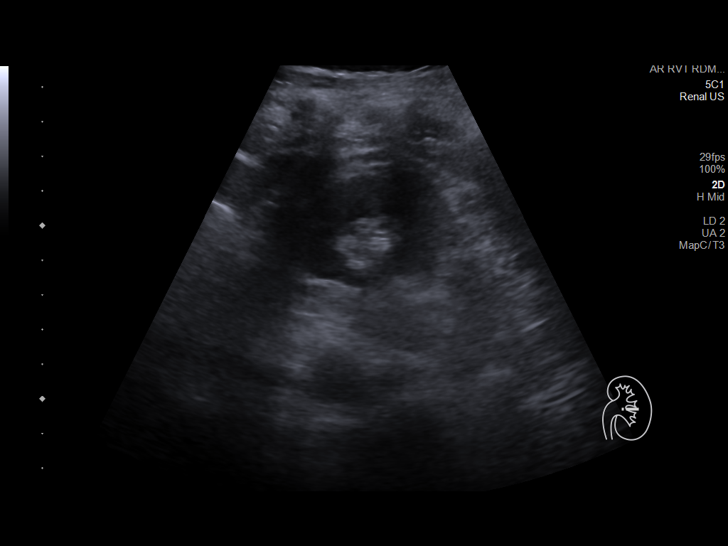
[im 49/59]
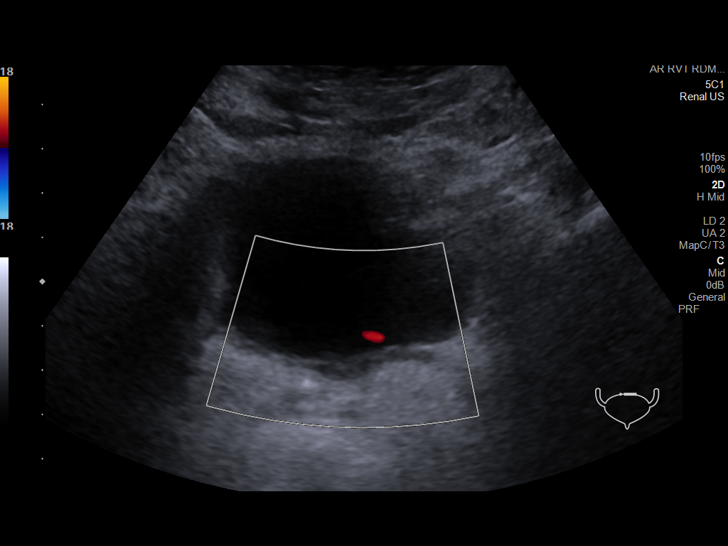
[im 54/59]
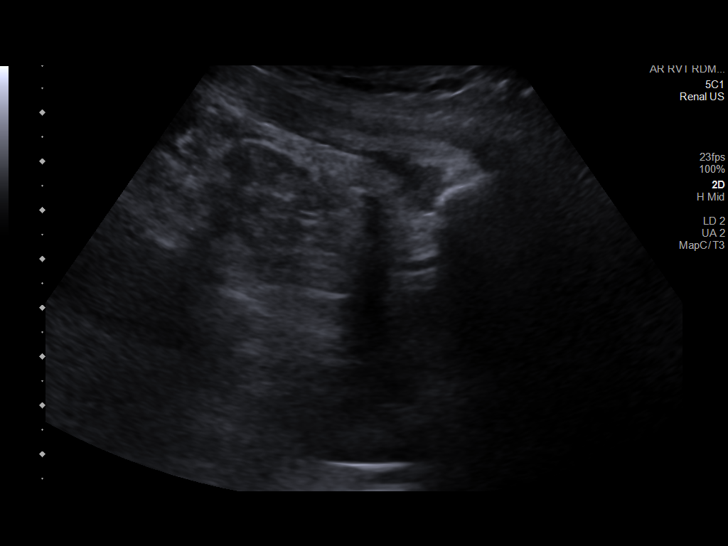
[im 59/59]
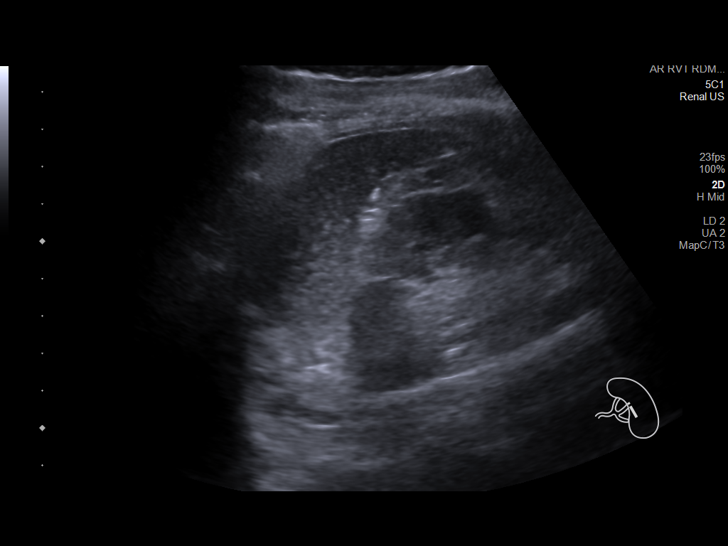

[14 of 25 positions shown; findings below may reference images not displayed]

FINDINGS: Right Kidney:

Renal measurements: 9.3 x 4.1 x 5.1 cm = volume: 102 mL. Increased
echogenicity. No mass or hydronephrosis visualized.

Left Kidney:

Renal measurements: 10.2 x 6.3 x 5.0 cm = volume: 169 mL. Increased
echogenicity. No mass or hydronephrosis visualized.

Bladder:

Appears normal for degree of bladder distention. Bilateral ureteral
jets are noted. Prevoid bladder volume is 38 mL. Postvoid bladder
volume is 35 mL.

Other:

None.
IMPRESSION: Increased renal echogenicity bilaterally, compatible with medical
renal disease.

## 2022-08-05 ENCOUNTER — Telehealth: Payer: Self-pay

## 2022-08-05 NOTE — Telephone Encounter (Signed)
Follow up call from triage Patient contacted and notified of appointment time and date. Patient voiced understanding

## 2022-08-12 ENCOUNTER — Ambulatory Visit: Payer: 59 | Admitting: Urology
